# Patient Record
Sex: Female | Born: 1940 | Race: White | Hispanic: No | Marital: Married | Smoking: Never smoker
Health system: Southern US, Community
[De-identification: ages and names within clinical notes are randomized; demographics above are authoritative.]

## PROBLEM LIST (undated history)

## (undated) DIAGNOSIS — M810 Age-related osteoporosis without current pathological fracture: Secondary | ICD-10-CM

## (undated) DIAGNOSIS — IMO0002 Reserved for concepts with insufficient information to code with codable children: Secondary | ICD-10-CM

## (undated) DIAGNOSIS — J189 Pneumonia, unspecified organism: Secondary | ICD-10-CM

## (undated) DIAGNOSIS — G4700 Insomnia, unspecified: Secondary | ICD-10-CM

## (undated) DIAGNOSIS — G43709 Chronic migraine without aura, not intractable, without status migrainosus: Secondary | ICD-10-CM

## (undated) DIAGNOSIS — H8110 Benign paroxysmal vertigo, unspecified ear: Secondary | ICD-10-CM

## (undated) DIAGNOSIS — K219 Gastro-esophageal reflux disease without esophagitis: Secondary | ICD-10-CM

## (undated) HISTORY — PX: ADENOIDECTOMY: SUR15

## (undated) HISTORY — PX: TONSILLECTOMY AND ADENOIDECTOMY: SHX28

## (undated) HISTORY — DX: Benign paroxysmal vertigo, unspecified ear: H81.10

## (undated) HISTORY — DX: Insomnia, unspecified: G47.00

## (undated) HISTORY — PX: TONSILLECTOMY: SUR1361

## (undated) HISTORY — PX: OTHER SURGICAL HISTORY: SHX169

## (undated) HISTORY — DX: Age-related osteoporosis without current pathological fracture: M81.0

## (undated) HISTORY — DX: Pneumonia, unspecified organism: J18.9

## (undated) HISTORY — DX: Reserved for concepts with insufficient information to code with codable children: IMO0002

## (undated) HISTORY — PX: CATARACT EXTRACTION, BILATERAL: SHX1313

## (undated) HISTORY — PX: IRRIGATION AND DEBRIDEMENT SEBACEOUS CYST: SHX5255

## (undated) HISTORY — DX: Chronic migraine without aura, not intractable, without status migrainosus: G43.709

---

## 2006-04-06 ENCOUNTER — Encounter: Admission: RE | Admit: 2006-04-06 | Discharge: 2006-04-06 | Payer: Self-pay | Admitting: Internal Medicine

## 2007-05-17 ENCOUNTER — Encounter: Admission: RE | Admit: 2007-05-17 | Discharge: 2007-05-17 | Payer: Self-pay | Admitting: Internal Medicine

## 2008-05-28 ENCOUNTER — Encounter: Admission: RE | Admit: 2008-05-28 | Discharge: 2008-05-28 | Payer: Self-pay | Admitting: Internal Medicine

## 2009-06-05 ENCOUNTER — Encounter: Admission: RE | Admit: 2009-06-05 | Discharge: 2009-06-05 | Payer: Self-pay | Admitting: Internal Medicine

## 2010-05-31 ENCOUNTER — Other Ambulatory Visit: Payer: Self-pay | Admitting: Internal Medicine

## 2010-05-31 DIAGNOSIS — Z1239 Encounter for other screening for malignant neoplasm of breast: Secondary | ICD-10-CM

## 2010-06-16 ENCOUNTER — Ambulatory Visit
Admission: RE | Admit: 2010-06-16 | Discharge: 2010-06-16 | Disposition: A | Discharge status: Home / Self Care | Payer: Self-pay | Source: Ambulatory Visit | Attending: Internal Medicine | Admitting: Internal Medicine

## 2010-06-16 DIAGNOSIS — Z1239 Encounter for other screening for malignant neoplasm of breast: Secondary | ICD-10-CM

## 2011-05-15 ENCOUNTER — Other Ambulatory Visit: Payer: Self-pay | Admitting: Internal Medicine

## 2011-05-15 DIAGNOSIS — Z1231 Encounter for screening mammogram for malignant neoplasm of breast: Secondary | ICD-10-CM

## 2011-06-08 DIAGNOSIS — R05 Cough: Secondary | ICD-10-CM | POA: Diagnosis not present

## 2011-07-02 ENCOUNTER — Ambulatory Visit
Admission: RE | Admit: 2011-07-02 | Discharge: 2011-07-02 | Disposition: A | Discharge status: Home / Self Care | Payer: 59 | Source: Ambulatory Visit | Attending: Internal Medicine | Admitting: Internal Medicine

## 2011-07-02 DIAGNOSIS — Z1231 Encounter for screening mammogram for malignant neoplasm of breast: Secondary | ICD-10-CM | POA: Diagnosis not present

## 2011-10-13 DIAGNOSIS — R05 Cough: Secondary | ICD-10-CM | POA: Diagnosis not present

## 2011-10-13 DIAGNOSIS — Z23 Encounter for immunization: Secondary | ICD-10-CM | POA: Diagnosis not present

## 2011-10-13 DIAGNOSIS — J209 Acute bronchitis, unspecified: Secondary | ICD-10-CM | POA: Diagnosis not present

## 2011-10-13 DIAGNOSIS — R490 Dysphonia: Secondary | ICD-10-CM | POA: Diagnosis not present

## 2011-12-25 DIAGNOSIS — J189 Pneumonia, unspecified organism: Secondary | ICD-10-CM

## 2011-12-25 HISTORY — DX: Pneumonia, unspecified organism: J18.9

## 2012-01-19 DIAGNOSIS — Z23 Encounter for immunization: Secondary | ICD-10-CM | POA: Diagnosis not present

## 2012-01-25 DIAGNOSIS — R05 Cough: Secondary | ICD-10-CM | POA: Diagnosis not present

## 2012-01-25 DIAGNOSIS — J209 Acute bronchitis, unspecified: Secondary | ICD-10-CM | POA: Diagnosis not present

## 2012-02-10 ENCOUNTER — Ambulatory Visit (INDEPENDENT_AMBULATORY_CARE_PROVIDER_SITE_OTHER): Payer: Medicare Other | Admitting: Internal Medicine

## 2012-02-10 ENCOUNTER — Encounter: Payer: Self-pay | Admitting: Internal Medicine

## 2012-02-10 VITALS — BP 140/80 | HR 81 | Temp 98.5°F | Ht 66.0 in | Wt 139.2 lb

## 2012-02-10 DIAGNOSIS — R059 Cough, unspecified: Secondary | ICD-10-CM

## 2012-02-10 DIAGNOSIS — R05 Cough: Secondary | ICD-10-CM

## 2012-02-10 MED ORDER — OMEPRAZOLE 20 MG PO CPDR: DELAYED_RELEASE_CAPSULE | ORAL | Status: DC

## 2012-02-10 NOTE — Patient Instructions (Addendum)
Stop symbicort and albuterol  Try prilosec 20mg   Take 30-60 min before first meal of the day and Pepcid 20 mg one bedtime until cough is completely gone for at least a week without the need for cough suppression (delsym cough syrup)    GERD (REFLUX)  is an extremely common cause of respiratory symptoms, many times with no significant heartburn at all.    It can be treated with medication, but also with lifestyle changes including avoidance of late meals, excessive alcohol, smoking cessation, and avoid fatty foods, chocolate, peppermint, colas, red wine, and acidic juices such as orange juice.  NO MINT OR MENTHOL PRODUCTS SO NO COUGH DROPS  USE SUGARLESS CANDY INSTEAD (jolley ranchers or Stover's)  NO OIL BASED VITAMINS - use powdered substitutes.   Stay off fosfamax for now - it is available in IV form (reclast) once yearly and that might be a better choice for you if Dr Clelia Croft feels you need it  Stop benadryl and chlortrimeton 4mg  every 4-6 hours   If cough flares return here as soon as possible for step 2 - if cough resolves be sure to let Dr Clelia Croft and your friends know!

## 2012-02-10 NOTE — Progress Notes (Signed)
Subjective:    Patient ID: Shelia Cunningham, female    DOB: 1941/04/01    MRN: [id number]  HPI  26 yowf never smoker with no previous resp problem whatsoever until 04/2009 with persistent daily cough since so referred 02/10/2012 to pulmonary by Dr Olin Hauser.   02/10/2012 1st pulmonary eval cc cough daily best treatment seems to be abx but stopped fosfamax on 01/13/12 and some better since then, in general cough is more dry than wet, throat irritation, worse p stir in am and not present during heavy sleep. No better on symbicort in fact hoarseness worse, cough may be worse as well- no assoc sob or overt sinus or hb symptoms  Sleeping ok without nocturnal  or early am exacerbation  of respiratory  c/o's or need for noct saba. Also denies any obvious fluctuation of symptoms with weather or environmental changes or other aggravating or alleviating factors except as outlined above      Kouffman Reflux v Neurogenic Cough Differentiator Reflux Comments  Do you awaken from a sound sleep coughing violently?                            With trouble breathing? No   Do you have choking episodes when you cannot  Get enough air, gasping for air ?              No   Do you usually cough when you lie down into  The bed, or when you just lie down to rest ?                          Varies, does cough with naps   Do you usually cough after meals or eating?         NO   Do you cough when (or after) you bend over?    No   GERD SCORE     Kouffman Reflux v Neurogenic Cough Differentiator Neurogenic   Do you more-or-less cough all day long? yes   Does change of temperature make you cough? no   Does laughing or chuckling cause you to cough? yes   Do fumes (perfume, automobile fumes, burned  Toast, etc.,) cause you to cough ?      No    Does speaking, singing, or talking on the phone cause you to cough   ?               Yes    Neurogenic/Airway score       Review of Systems  Constitutional: Negative for fever,  chills and unexpected weight change.  HENT: Positive for voice change. Negative for ear pain, nosebleeds, congestion, sore throat, rhinorrhea, sneezing, trouble swallowing, dental problem, postnasal drip and sinus pressure.   Eyes: Negative for visual disturbance.  Respiratory: Positive for cough. Negative for choking and shortness of breath.   Cardiovascular: Negative for chest pain and leg swelling.  Gastrointestinal: Negative for vomiting, abdominal pain and diarrhea.  Genitourinary: Negative for difficulty urinating.  Musculoskeletal: Negative for arthralgias.  Skin: Negative for rash.  Neurological: Negative for tremors, syncope and headaches.  Hematological: Does not bruise/bleed easily.       Objective:   Physical Exam  Very hoarse with voice fatigue Wt Readings from Last 3 Encounters:  02/10/12 139 lb 3.2 oz (63.141 kg)    HEENT: nl dentition, turbinates, and orophanx. Nl external ear canals without cough reflex  NECK :  without JVD/Nodes/TM/ nl carotid upstrokes bilaterally   LUNGS: no acc muscle use, clear to A and P bilaterally without cough on insp or exp maneuvers   CV:  RRR  no s3 or murmur or increase in P2, no edema   ABD:  soft and nontender with nl excursion in the supine position. No bruits or organomegaly, bowel sounds nl  MS:  warm without deformities, calf tenderness, cyanosis or clubbing  SKIN: warm and dry without lesions    NEURO:  alert, approp, no deficits   cxr 01/25/12 Report is wnl      Assessment & Plan:

## 2012-02-11 DIAGNOSIS — R058 Other specified cough: Secondary | ICD-10-CM | POA: Insufficient documentation

## 2012-02-11 DIAGNOSIS — R05 Cough: Secondary | ICD-10-CM | POA: Insufficient documentation

## 2012-02-11 NOTE — Assessment & Plan Note (Addendum)
The most common causes of chronic cough in immunocompetent adults include the following: upper airway cough syndrome (UACS), previously referred to as postnasal drip syndrome (PNDS), which is caused by variety of rhinosinus conditions; (2) asthma; (3) GERD; (4) chronic bronchitis from cigarette smoking or other inhaled environmental irritants; (5) nonasthmatic eosinophilic bronchitis; and (6) bronchiectasis.   These conditions, singly or in combination, have accounted for up to 94% of the causes of chronic cough in prospective studies.   Other conditions have constituted no >6% of the causes in prospective studies These have included bronchogenic carcinoma, chronic interstitial pneumonia, sarcoidosis, left ventricular failure, ACEI-induced cough, and aspiration from a condition associated with pharyngeal dysfunction.   .Chronic cough is often simultaneously caused by more than one condition. A single cause has been found from 38 to 82% of the time, multiple causes from 18 to 62%. Multiply caused cough has been the result of three diseases up to 42% of the time.     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   These are the same pts (now being labeled as having "irritable larynx syndrome" by some cough centers) who not infrequently have a history of having failed to tolerate ace inhibitors,  dry powder inhalers or any form of ICS or biphosphonates or report having atypical reflux symptoms that don't respond to standard doses of PPI , and are easily confused  as having aecopd or asthma flares by even experienced allergists/ pulmonologists.   Will change benadryl to chlortrimeton per cough guidelines (more anticholinergic so more effective in pnds)  For now rec cyclical cough regimen, try off ics and maintain off fosfamax then regroup w/in 4 weeks if not better

## 2012-02-17 DIAGNOSIS — J189 Pneumonia, unspecified organism: Secondary | ICD-10-CM | POA: Diagnosis not present

## 2012-02-19 ENCOUNTER — Institutional Professional Consult (permissible substitution): Payer: Medicare Other | Admitting: Pulmonary Disease

## 2012-02-23 ENCOUNTER — Encounter: Payer: Self-pay | Admitting: Internal Medicine

## 2012-02-23 DIAGNOSIS — J189 Pneumonia, unspecified organism: Secondary | ICD-10-CM | POA: Insufficient documentation

## 2012-04-19 DIAGNOSIS — G43909 Migraine, unspecified, not intractable, without status migrainosus: Secondary | ICD-10-CM | POA: Diagnosis not present

## 2012-04-19 DIAGNOSIS — R82998 Other abnormal findings in urine: Secondary | ICD-10-CM | POA: Diagnosis not present

## 2012-04-19 DIAGNOSIS — G4700 Insomnia, unspecified: Secondary | ICD-10-CM | POA: Diagnosis not present

## 2012-04-19 DIAGNOSIS — Z0000 Encounter for general adult medical examination without abnormal findings: Secondary | ICD-10-CM | POA: Diagnosis not present

## 2012-04-19 DIAGNOSIS — M899 Disorder of bone, unspecified: Secondary | ICD-10-CM | POA: Diagnosis not present

## 2012-04-19 DIAGNOSIS — M949 Disorder of cartilage, unspecified: Secondary | ICD-10-CM | POA: Diagnosis not present

## 2012-04-25 DIAGNOSIS — H8110 Benign paroxysmal vertigo, unspecified ear: Secondary | ICD-10-CM | POA: Diagnosis not present

## 2012-04-25 DIAGNOSIS — G4700 Insomnia, unspecified: Secondary | ICD-10-CM | POA: Diagnosis not present

## 2012-04-25 DIAGNOSIS — Z0000 Encounter for general adult medical examination without abnormal findings: Secondary | ICD-10-CM | POA: Diagnosis not present

## 2012-04-25 DIAGNOSIS — R05 Cough: Secondary | ICD-10-CM | POA: Diagnosis not present

## 2012-04-27 ENCOUNTER — Encounter: Payer: Self-pay | Admitting: Gastroenterology

## 2012-04-28 DIAGNOSIS — Z1212 Encounter for screening for malignant neoplasm of rectum: Secondary | ICD-10-CM | POA: Diagnosis not present

## 2012-05-24 ENCOUNTER — Ambulatory Visit (AMBULATORY_SURGERY_CENTER): Payer: Medicare Other

## 2012-05-24 VITALS — Ht 65.5 in | Wt 141.8 lb

## 2012-05-24 DIAGNOSIS — Z1211 Encounter for screening for malignant neoplasm of colon: Secondary | ICD-10-CM

## 2012-05-24 MED ORDER — MOVIPREP 100 G PO SOLR: ORAL | Status: DC

## 2012-06-02 DIAGNOSIS — M810 Age-related osteoporosis without current pathological fracture: Secondary | ICD-10-CM | POA: Diagnosis not present

## 2012-06-02 DIAGNOSIS — R05 Cough: Secondary | ICD-10-CM | POA: Diagnosis not present

## 2012-06-06 ENCOUNTER — Encounter: Payer: Self-pay | Admitting: Gastroenterology

## 2012-06-06 ENCOUNTER — Ambulatory Visit (AMBULATORY_SURGERY_CENTER): Payer: Medicare Other | Admitting: Gastroenterology

## 2012-06-06 VITALS — BP 128/61 | HR 60 | Temp 97.5°F | Resp 14 | Ht 65.5 in | Wt 141.0 lb

## 2012-06-06 DIAGNOSIS — R05 Cough: Secondary | ICD-10-CM | POA: Diagnosis not present

## 2012-06-06 DIAGNOSIS — Z1211 Encounter for screening for malignant neoplasm of colon: Secondary | ICD-10-CM | POA: Diagnosis not present

## 2012-06-06 MED ORDER — SODIUM CHLORIDE 0.9 % IV SOLN: 500.0000 mL | INTRAVENOUS | Status: DC

## 2012-06-06 NOTE — Op Note (Signed)
[facility] [address] [place], [address]   COLONOSCOPY PROCEDURE REPORT  PATIENT: Shelia Cunningham, Shelia Cunningham  MR#: [id number] BIRTHDATE: 1941-02-26 , 71  yrs. old GENDER: Female ENDOSCOPIST: Meryl Dare, MD, [facility] REFERRED BY:W.  Buren Kos, M.D. PROCEDURE DATE:  06/06/2012 PROCEDURE:   Colonoscopy, screening ASA CLASS:   Class II INDICATIONS:average risk screening. MEDICATIONS: MAC sedation, administered by CRNA and propofol (Diprivan) 350mg  IV DESCRIPTION OF PROCEDURE:   After the risks benefits and alternatives of the procedure were thoroughly explained, informed consent was obtained.  A digital rectal exam revealed no abnormalities of the rectum.   The LB [id number]-[id number]  endoscope was introduced through the anus and advanced to the cecum, which was identified by both the appendix and ileocecal valve. No adverse events experienced.   The quality of the prep was good, using MoviPrep  The instrument was then slowly withdrawn as the colon was fully examined.  COLON FINDINGS: A normal appearing cecum, ileocecal valve, and appendiceal orifice were identified.  The ascending, hepatic flexure, transverse, splenic flexure, descending, sigmoid colon and rectum appeared unremarkable.  No polyps or cancers were seen. Retroflexed views revealed no abnormalities. The time to cecum=4 minutes 06 seconds.  Withdrawal time=11 minutes 01 seconds.  The scope was withdrawn and the procedure completed.  COMPLICATIONS: There were no complications.  ENDOSCOPIC IMPRESSION: 1.  Normal colon  RECOMMENDATIONS: 1.  Given your age, you will not need another colonoscopy for colon cancer screening or polyp surveillance.  These types of tests usually stop around the age 35.   eSigned:  Meryl Dare, MD, [facility] 06/06/2012 9:26 AM

## 2012-06-06 NOTE — Patient Instructions (Addendum)

## 2012-06-06 NOTE — Progress Notes (Signed)
VSS. A&O x3, Pleased with MAC. Report to April RN. DRM

## 2012-06-06 NOTE — Progress Notes (Signed)
Patient did not experience any of the following events: a burn prior to discharge; a fall within the facility; wrong site/side/patient/procedure/implant event; or a hospital transfer or hospital admission upon discharge from the facility. (G8907) Patient did not have preoperative order for IV antibiotic SSI prophylaxis. (G8918)  

## 2012-06-07 ENCOUNTER — Telehealth: Payer: Self-pay | Admitting: *Deleted

## 2012-06-07 NOTE — Telephone Encounter (Signed)
  Follow up Call-  Call back number 06/06/2012  Post procedure Call Back phone  # [phone]  Permission to leave phone message Yes     Patient questions:  Do you have a fever, pain , or abdominal swelling? no Pain Score  0 *  Have you tolerated food without any problems? yes  Have you been able to return to your normal activities? yes  Do you have any questions about your discharge instructions: Diet   no Medications  no Follow up visit  no  Do you have questions or concerns about your Care? no  Actions: * If pain score is 4 or above: No action needed, pain <4.

## 2012-06-13 ENCOUNTER — Other Ambulatory Visit: Payer: Self-pay | Admitting: Internal Medicine

## 2012-06-13 DIAGNOSIS — Z1231 Encounter for screening mammogram for malignant neoplasm of breast: Secondary | ICD-10-CM

## 2012-07-15 ENCOUNTER — Ambulatory Visit: Payer: Medicare Other

## 2012-08-08 ENCOUNTER — Ambulatory Visit
Admission: RE | Admit: 2012-08-08 | Discharge: 2012-08-08 | Disposition: A | Discharge status: Home / Self Care | Payer: Medicare Other | Source: Ambulatory Visit | Attending: Internal Medicine | Admitting: Internal Medicine

## 2012-08-08 DIAGNOSIS — Z1231 Encounter for screening mammogram for malignant neoplasm of breast: Secondary | ICD-10-CM | POA: Diagnosis not present

## 2012-09-21 DIAGNOSIS — IMO0002 Reserved for concepts with insufficient information to code with codable children: Secondary | ICD-10-CM | POA: Diagnosis not present

## 2012-09-21 DIAGNOSIS — A088 Other specified intestinal infections: Secondary | ICD-10-CM | POA: Diagnosis not present

## 2012-09-21 DIAGNOSIS — R634 Abnormal weight loss: Secondary | ICD-10-CM | POA: Diagnosis not present

## 2012-09-23 ENCOUNTER — Inpatient Hospital Stay (HOSPITAL_COMMUNITY): Payer: Medicare Other | Admitting: Certified Registered"

## 2012-09-23 ENCOUNTER — Inpatient Hospital Stay (HOSPITAL_COMMUNITY)
Admission: EM | Admit: 2012-09-23 | Discharge: 2012-10-01 | DRG: 339 | Disposition: A | Discharge status: Home / Self Care | Payer: Medicare Other | Attending: General Surgery | Admitting: General Surgery

## 2012-09-23 ENCOUNTER — Encounter (HOSPITAL_COMMUNITY): Payer: Self-pay | Admitting: Certified Registered"

## 2012-09-23 ENCOUNTER — Encounter (HOSPITAL_COMMUNITY): Admission: EM | Disposition: A | Discharge status: Home / Self Care | Payer: Self-pay | Source: Home / Self Care

## 2012-09-23 ENCOUNTER — Encounter (HOSPITAL_COMMUNITY): Payer: Self-pay | Admitting: Emergency Medicine

## 2012-09-23 DIAGNOSIS — K37 Unspecified appendicitis: Secondary | ICD-10-CM

## 2012-09-23 DIAGNOSIS — R05 Cough: Secondary | ICD-10-CM

## 2012-09-23 DIAGNOSIS — K560 Paralytic ileus: Secondary | ICD-10-CM | POA: Diagnosis present

## 2012-09-23 DIAGNOSIS — T40605A Adverse effect of unspecified narcotics, initial encounter: Secondary | ICD-10-CM | POA: Diagnosis not present

## 2012-09-23 DIAGNOSIS — K56609 Unspecified intestinal obstruction, unspecified as to partial versus complete obstruction: Secondary | ICD-10-CM | POA: Diagnosis not present

## 2012-09-23 DIAGNOSIS — K219 Gastro-esophageal reflux disease without esophagitis: Secondary | ICD-10-CM | POA: Diagnosis not present

## 2012-09-23 DIAGNOSIS — M4120 Other idiopathic scoliosis, site unspecified: Secondary | ICD-10-CM | POA: Diagnosis not present

## 2012-09-23 DIAGNOSIS — J189 Pneumonia, unspecified organism: Secondary | ICD-10-CM

## 2012-09-23 DIAGNOSIS — E871 Hypo-osmolality and hyponatremia: Secondary | ICD-10-CM | POA: Diagnosis not present

## 2012-09-23 DIAGNOSIS — F19921 Other psychoactive substance use, unspecified with intoxication with delirium: Secondary | ICD-10-CM | POA: Diagnosis not present

## 2012-09-23 DIAGNOSIS — K3533 Acute appendicitis with perforation and localized peritonitis, with abscess: Principal | ICD-10-CM | POA: Diagnosis present

## 2012-09-23 DIAGNOSIS — R404 Transient alteration of awareness: Secondary | ICD-10-CM | POA: Diagnosis not present

## 2012-09-23 DIAGNOSIS — R5381 Other malaise: Secondary | ICD-10-CM | POA: Diagnosis not present

## 2012-09-23 DIAGNOSIS — K352 Acute appendicitis with generalized peritonitis, without abscess: Secondary | ICD-10-CM

## 2012-09-23 DIAGNOSIS — K3580 Unspecified acute appendicitis: Secondary | ICD-10-CM | POA: Diagnosis not present

## 2012-09-23 DIAGNOSIS — K651 Peritoneal abscess: Secondary | ICD-10-CM

## 2012-09-23 HISTORY — DX: Gastro-esophageal reflux disease without esophagitis: K21.9

## 2012-09-23 HISTORY — PX: LAPAROSCOPIC APPENDECTOMY: SHX408

## 2012-09-23 LAB — URINALYSIS, ROUTINE W REFLEX MICROSCOPIC
Bilirubin Urine: NEGATIVE
Ketones, ur: 40 mg/dL — AB
Nitrite: NEGATIVE
Protein, ur: 100 mg/dL — AB
Urobilinogen, UA: 0.2 mg/dL (ref 0.0–1.0)

## 2012-09-23 LAB — COMPREHENSIVE METABOLIC PANEL
ALT: 24 U/L (ref 0–35)
Alkaline Phosphatase: 95 U/L (ref 39–117)
CO2: 25 mEq/L (ref 19–32)
GFR calc Af Amer: >90 mL/min (ref >90)
GFR calc non Af Amer: 83 mL/min — ABNORMAL LOW (ref >90)
Glucose, Bld: 123 mg/dL — ABNORMAL HIGH (ref 70–99)
Potassium: 3.5 mEq/L (ref 3.5–5.1)
Sodium: 133 mEq/L — ABNORMAL LOW (ref 135–145)
Total Bilirubin: 0.4 mg/dL (ref 0.3–1.2)

## 2012-09-23 LAB — CBC WITH DIFFERENTIAL/PLATELET
Eosinophils Relative: 0 % (ref 0–5)
Hemoglobin: 14.4 g/dL (ref 12.0–15.0)
Lymphocytes Relative: 9 % — ABNORMAL LOW (ref 12–46)
Lymphs Abs: 1.1 10*3/uL (ref 0.7–4.0)
MCV: 87.3 fL (ref 78.0–100.0)
Monocytes Relative: 6 % (ref 3–12)
Neutrophils Relative %: 84 % — ABNORMAL HIGH (ref 43–77)
Platelets: 270 10*3/uL (ref 150–400)
RBC: 4.73 MIL/uL (ref 3.87–5.11)
WBC: 12.5 10*3/uL — ABNORMAL HIGH (ref 4.0–10.5)

## 2012-09-23 LAB — URINE MICROSCOPIC-ADD ON

## 2012-09-23 SURGERY — APPENDECTOMY, LAPAROSCOPIC
Anesthesia: General | Site: Abdomen | Wound class: Dirty or Infected

## 2012-09-23 MED ORDER — ONDANSETRON HCL 4 MG/2ML IJ SOLN
INTRAMUSCULAR | Status: DC | PRN
  Administered 2012-09-23: 4 mg via INTRAVENOUS

## 2012-09-23 MED ORDER — GLYCOPYRROLATE 0.2 MG/ML IJ SOLN
INTRAMUSCULAR | Status: DC | PRN
  Administered 2012-09-23: 0.4 mg via INTRAVENOUS

## 2012-09-23 MED ORDER — BUPIVACAINE-EPINEPHRINE 0.25% -1:200000 IJ SOLN
INTRAMUSCULAR | Status: DC | PRN
  Administered 2012-09-23: 18 mL

## 2012-09-23 MED ORDER — ONDANSETRON HCL 4 MG/2ML IJ SOLN
4.0000 mg | Freq: Once | INTRAMUSCULAR | Status: AC
  Administered 2012-09-23: 4 mg via INTRAVENOUS
  Filled 2012-09-23: qty 2

## 2012-09-23 MED ORDER — PIPERACILLIN-TAZOBACTAM 3.375 G IVPB
3.3750 g | Freq: Three times a day (TID) | INTRAVENOUS | Status: DC
  Administered 2012-09-23 – 2012-09-30 (×20): 3.375 g via INTRAVENOUS
  Filled 2012-09-23 (×22): qty 50

## 2012-09-23 MED ORDER — OXYCODONE HCL 5 MG PO TABS: 5.0000 mg | ORAL_TABLET | ORAL | Status: DC | PRN

## 2012-09-23 MED ORDER — PROPOFOL 10 MG/ML IV BOLUS
INTRAVENOUS | Status: DC | PRN
  Administered 2012-09-23: 40 mg via INTRAVENOUS
  Administered 2012-09-23: 160 mg via INTRAVENOUS

## 2012-09-23 MED ORDER — LIDOCAINE HCL (CARDIAC) 20 MG/ML IV SOLN
INTRAVENOUS | Status: DC | PRN
  Administered 2012-09-23: 60 mg via INTRAVENOUS

## 2012-09-23 MED ORDER — SODIUM CHLORIDE 0.9 % IV BOLUS (SEPSIS)
500.0000 mL | Freq: Once | INTRAVENOUS | Status: AC
  Administered 2012-09-23: 500 mL via INTRAVENOUS

## 2012-09-23 MED ORDER — HYDROMORPHONE HCL PF 1 MG/ML IJ SOLN
0.2500 mg | INTRAMUSCULAR | Status: DC | PRN
  Administered 2012-09-23 (×2): 0.5 mg via INTRAVENOUS

## 2012-09-23 MED ORDER — SODIUM CHLORIDE 0.9 % IR SOLN
Status: DC | PRN
  Administered 2012-09-23: 1000 mL

## 2012-09-23 MED ORDER — ALBUTEROL SULFATE HFA 108 (90 BASE) MCG/ACT IN AERS
2.0000 | INHALATION_SPRAY | Freq: Four times a day (QID) | RESPIRATORY_TRACT | Status: DC | PRN
  Filled 2012-09-23: qty 6.7

## 2012-09-23 MED ORDER — LACTATED RINGERS IV SOLN
INTRAVENOUS | Status: DC | PRN
  Administered 2012-09-23 (×2): via INTRAVENOUS

## 2012-09-23 MED ORDER — ROCURONIUM BROMIDE 100 MG/10ML IV SOLN
INTRAVENOUS | Status: DC | PRN
  Administered 2012-09-23: 30 mg via INTRAVENOUS

## 2012-09-23 MED ORDER — ONDANSETRON HCL 4 MG/2ML IJ SOLN
4.0000 mg | Freq: Four times a day (QID) | INTRAMUSCULAR | Status: DC | PRN
  Administered 2012-09-23 – 2012-09-25 (×4): 4 mg via INTRAVENOUS
  Filled 2012-09-23 (×4): qty 2

## 2012-09-23 MED ORDER — NEOSTIGMINE METHYLSULFATE 1 MG/ML IJ SOLN
INTRAMUSCULAR | Status: DC | PRN
  Administered 2012-09-23: 3 mg via INTRAVENOUS

## 2012-09-23 MED ORDER — DEXAMETHASONE SODIUM PHOSPHATE 10 MG/ML IJ SOLN
INTRAMUSCULAR | Status: DC | PRN
  Administered 2012-09-23: 4 mg via INTRAVENOUS

## 2012-09-23 MED ORDER — KCL IN DEXTROSE-NACL 20-5-0.45 MEQ/L-%-% IV SOLN
INTRAVENOUS | Status: DC
  Administered 2012-09-23 – 2012-09-24 (×2): via INTRAVENOUS
  Filled 2012-09-23 (×6): qty 1000

## 2012-09-23 MED ORDER — FENTANYL CITRATE 0.05 MG/ML IJ SOLN
INTRAMUSCULAR | Status: DC | PRN
  Administered 2012-09-23: 100 ug via INTRAVENOUS
  Administered 2012-09-23 (×3): 50 ug via INTRAVENOUS

## 2012-09-23 MED ORDER — HEPARIN SODIUM (PORCINE) 5000 UNIT/ML IJ SOLN
5000.0000 [IU] | Freq: Three times a day (TID) | INTRAMUSCULAR | Status: DC
  Administered 2012-09-23 – 2012-10-01 (×23): 5000 [IU] via SUBCUTANEOUS
  Filled 2012-09-23 (×29): qty 1

## 2012-09-23 MED ORDER — HYDROMORPHONE HCL PF 1 MG/ML IJ SOLN
INTRAMUSCULAR | Status: AC
  Filled 2012-09-23: qty 1

## 2012-09-23 MED ORDER — AMLODIPINE BESYLATE 5 MG PO TABS
5.0000 mg | ORAL_TABLET | Freq: Every day | ORAL | Status: DC
  Administered 2012-09-24: 5 mg via ORAL
  Filled 2012-09-23 (×9): qty 1

## 2012-09-23 MED ORDER — 0.9 % SODIUM CHLORIDE (POUR BTL) OPTIME
TOPICAL | Status: DC | PRN
  Administered 2012-09-23: 1000 mL

## 2012-09-23 MED ORDER — SUCCINYLCHOLINE CHLORIDE 20 MG/ML IJ SOLN
INTRAMUSCULAR | Status: DC | PRN
  Administered 2012-09-23: 100 mg via INTRAVENOUS

## 2012-09-23 MED ORDER — FENTANYL CITRATE 0.05 MG/ML IJ SOLN
50.0000 ug | Freq: Once | INTRAMUSCULAR | Status: AC
  Administered 2012-09-23: 50 ug via INTRAVENOUS
  Filled 2012-09-23: qty 2

## 2012-09-23 MED ORDER — PIPERACILLIN-TAZOBACTAM 3.375 G IVPB 30 MIN
3.3750 g | Freq: Once | INTRAVENOUS | Status: AC
  Administered 2012-09-23: 3.375 g via INTRAVENOUS
  Filled 2012-09-23: qty 50

## 2012-09-23 MED ORDER — MORPHINE SULFATE 2 MG/ML IJ SOLN
1.0000 mg | INTRAMUSCULAR | Status: DC | PRN
  Administered 2012-09-23 (×2): 2 mg via INTRAVENOUS
  Filled 2012-09-23 (×2): qty 1

## 2012-09-23 MED ORDER — BUDESONIDE-FORMOTEROL FUMARATE 80-4.5 MCG/ACT IN AERO
2.0000 | INHALATION_SPRAY | Freq: Two times a day (BID) | RESPIRATORY_TRACT | Status: DC
  Filled 2012-09-23: qty 6.9

## 2012-09-23 MED ORDER — ALBUMIN HUMAN 5 % IV SOLN
INTRAVENOUS | Status: DC | PRN
  Administered 2012-09-23: 14:00:00 via INTRAVENOUS

## 2012-09-23 MED ORDER — MIDAZOLAM HCL 5 MG/5ML IJ SOLN
INTRAMUSCULAR | Status: DC | PRN
  Administered 2012-09-23: 2 mg via INTRAVENOUS

## 2012-09-23 MED ORDER — PANTOPRAZOLE SODIUM 40 MG PO TBEC
40.0000 mg | DELAYED_RELEASE_TABLET | Freq: Every day | ORAL | Status: DC
  Administered 2012-09-23 – 2012-10-01 (×6): 40 mg via ORAL
  Filled 2012-09-23 (×7): qty 1

## 2012-09-23 SURGICAL SUPPLY — 59 items
ADH SKN CLS APL DERMABOND .7 (GAUZE/BANDAGES/DRESSINGS) ×1
APPLIER CLIP ROT 10 11.4 M/L (STAPLE)
APR CLP MED LRG 11.4X10 (STAPLE)
BAG SPEC RTRVL LRG 6X4 10 (ENDOMECHANICALS) ×1
BLADE SURG ROTATE 9660 (MISCELLANEOUS) IMPLANT
CANISTER SUCTION 2500CC (MISCELLANEOUS) ×2 IMPLANT
CHLORAPREP W/TINT 26ML (MISCELLANEOUS) ×2 IMPLANT
CLIP APPLIE ROT 10 11.4 M/L (STAPLE) IMPLANT
CLOTH BEACON ORANGE TIMEOUT ST (SAFETY) ×2 IMPLANT
COVER SURGICAL LIGHT HANDLE (MISCELLANEOUS) ×2 IMPLANT
CUTTER FLEX LINEAR 45M (STAPLE) ×1 IMPLANT
CUTTER LINEAR ENDO 35 ETS (STAPLE) IMPLANT
CUTTER LINEAR ENDO 35 ETS TH (STAPLE) IMPLANT
DECANTER SPIKE VIAL GLASS SM (MISCELLANEOUS) ×2 IMPLANT
DERMABOND ADVANCED (GAUZE/BANDAGES/DRESSINGS) ×1
DERMABOND ADVANCED .7 DNX12 (GAUZE/BANDAGES/DRESSINGS) ×1 IMPLANT
DRAIN CHANNEL 19F RND (DRAIN) ×1 IMPLANT
DRAPE UTILITY 15X26 W/TAPE STR (DRAPE) ×4 IMPLANT
ELECT REM PT RETURN 9FT ADLT (ELECTROSURGICAL) ×2
ELECTRODE REM PT RTRN 9FT ADLT (ELECTROSURGICAL) ×1 IMPLANT
ENDOLOOP SUT PDS II  0 18 (SUTURE)
ENDOLOOP SUT PDS II 0 18 (SUTURE) IMPLANT
EVACUATOR SILICONE 100CC (DRAIN) ×1 IMPLANT
GLOVE BIO SURGEON STRL SZ7.5 (GLOVE) ×1 IMPLANT
GLOVE BIO SURGEON STRL SZ8 (GLOVE) ×2 IMPLANT
GLOVE BIOGEL M 6.5 STRL (GLOVE) ×1 IMPLANT
GLOVE BIOGEL PI IND STRL 7.5 (GLOVE) IMPLANT
GLOVE BIOGEL PI IND STRL 8 (GLOVE) ×1 IMPLANT
GLOVE BIOGEL PI INDICATOR 7.5 (GLOVE) ×2
GLOVE BIOGEL PI INDICATOR 8 (GLOVE) ×1
GLOVE ECLIPSE 7.5 STRL STRAW (GLOVE) ×1 IMPLANT
GOWN PREVENTION PLUS XLARGE (GOWN DISPOSABLE) ×2 IMPLANT
GOWN STRL NON-REIN LRG LVL3 (GOWN DISPOSABLE) ×4 IMPLANT
KIT BASIN OR (CUSTOM PROCEDURE TRAY) ×2 IMPLANT
KIT ROOM TURNOVER OR (KITS) ×2 IMPLANT
NS IRRIG 1000ML POUR BTL (IV SOLUTION) ×2 IMPLANT
PAD ARMBOARD 7.5X6 YLW CONV (MISCELLANEOUS) ×4 IMPLANT
POUCH SPECIMEN RETRIEVAL 10MM (ENDOMECHANICALS) ×2 IMPLANT
RELOAD /EVU35 (ENDOMECHANICALS) ×1 IMPLANT
RELOAD 45 VASCULAR/THIN (ENDOMECHANICALS) ×2 IMPLANT
RELOAD CUTTER ETS 35MM STAND (ENDOMECHANICALS) IMPLANT
RELOAD STAPLE 45 2.5 WHT GRN (ENDOMECHANICALS) IMPLANT
SCALPEL HARMONIC ACE (MISCELLANEOUS) ×2 IMPLANT
SET IRRIG TUBING LAPAROSCOPIC (IRRIGATION / IRRIGATOR) ×2 IMPLANT
SPECIMEN JAR SMALL (MISCELLANEOUS) ×2 IMPLANT
SPONGE GAUZE 4X4 12PLY (GAUZE/BANDAGES/DRESSINGS) ×1 IMPLANT
SUT ETHILON 2 0 FS 18 (SUTURE) ×1 IMPLANT
SUT MNCRL AB 4-0 PS2 18 (SUTURE) ×1 IMPLANT
SUT VIC AB 4-0 PS2 27 (SUTURE) ×2 IMPLANT
SWAB COLLECTION DEVICE MRSA (MISCELLANEOUS) IMPLANT
TOWEL OR 17X24 6PK STRL BLUE (TOWEL DISPOSABLE) ×2 IMPLANT
TOWEL OR 17X26 10 PK STRL BLUE (TOWEL DISPOSABLE) ×2 IMPLANT
TRAY FOLEY CATH 14FR (SET/KITS/TRAYS/PACK) ×2 IMPLANT
TRAY LAPAROSCOPIC (CUSTOM PROCEDURE TRAY) ×2 IMPLANT
TROCAR XCEL 12X100 BLDLESS (ENDOMECHANICALS) ×2 IMPLANT
TROCAR XCEL BLUNT TIP 100MML (ENDOMECHANICALS) ×2 IMPLANT
TROCAR XCEL NON-BLD 5MMX100MML (ENDOMECHANICALS) ×2 IMPLANT
TUBE ANAEROBIC SPECIMEN COL (MISCELLANEOUS) IMPLANT
WATER STERILE IRR 1000ML POUR (IV SOLUTION) ×2 IMPLANT

## 2012-09-23 NOTE — H&P (Addendum)
Shelia Cunningham is an 72 y.o. female.   Chief Complaint: Abdominal pain, nausea and fever. HPI: Patient is a healthy 72 year old female she reports Monday 09/20/12 she developed some abdominal pain in her mid abdomen. She became nauseated and spiked a fever to 101.9. She continued to have discomfort throughout the day/night and into Tuesday. She continued to have fever and then developed diarrhea, which has persisted thru today. She has had ongoing  nausea with  a single episode of vomiting on Tuesday 09/21/12. She continued to have symptoms and was seen by Dr. Clelia Croft on Wednesday 09/22/12. She had some diffuse discomfort on abdominal exam according to the patient. She was treated with anti-emetics, she continued to have symptoms and on Thursday he scheduled her for a CT scan today.  Patient went for a CT scan this morning 09/24/14, it was done an outside facility. This shows acute advanced appendicitis with appendicolith and a high-grade small bowel obstruction. Dr. Janee Morn has reviewed the film and it is his opinion she also has an abscess. We are asked to see the patient in the ER at [facility] and plan to take her to the operating room as soon as they are ready.  Past Medical History  Diagnosis Date  . Chronic migraine   . Pneumonia, organism unspecified, 2013. 12-25-11    osteopenia and intolerant to Fosamax      history of herniated disc and scoliosis    . Vertigo, benign positional     Past Surgical History  Procedure Laterality Date  . Tonsillectomy and adenoidectomy     . Tonsillectomy        2 vaginal delivery       colonoscopy January 2013 Dr. Claudette Head    . Irrigation and debridement sebaceous cyst      on scalp/ 2 cysts  removed    Family History  Problem Relation Age of Onset  . Breast cancer Mother   . Cancer Father     bladder   Social History:  reports that she has never smoked. She has never used smokeless tobacco. She reports that she drinks about 4.2 ounces  of alcohol per week. She reports that she does not use illicit drugs. Drugs:  None Tobacco:  None Retired Education officer, [employer]. Allergies:  Allergies  Allergen Reactions  . Biaxin (Clarithromycin) Rash     (Not in a hospital admission)  Results for orders placed during the hospital encounter of 09/23/12 (from the past 48 hour(s))  CBC WITH DIFFERENTIAL     Status: Abnormal   Collection Time    09/23/12 11:24 AM      Result Value Range   WBC 12.5 (*) 4.0 - 10.5 K/uL   RBC 4.73  3.87 - 5.11 MIL/uL   Hemoglobin 14.4  12.0 - 15.0 g/dL   HCT 09.6  04.5 - 40.9 %   MCV 87.3  78.0 - 100.0 fL   MCH 30.4  26.0 - 34.0 pg   MCHC 34.9  30.0 - 36.0 g/dL   RDW 81.1  91.4 - 78.2 %   Platelets 270  150 - 400 K/uL   Neutrophils Relative % 84 (*) 43 - 77 %   Neutro Abs 10.5 (*) 1.7 - 7.7 K/uL   Lymphocytes Relative 9 (*) 12 - 46 %   Lymphs Abs 1.1  0.7 - 4.0 K/uL   Monocytes Relative 6  3 - 12 %   Monocytes Absolute 0.8  0.1 - 1.0 K/uL  Eosinophils Relative 0  0 - 5 %   Eosinophils Absolute 0.0  0.0 - 0.7 K/uL   Basophils Relative 0  0 - 1 %   Basophils Absolute 0.0  0.0 - 0.1 K/uL   No results found.  Review of Systems  Constitutional: Positive for fever (5 pounds since Monday), chills and weight loss. Negative for malaise/fatigue and diaphoresis.  HENT: Negative for hearing loss, ear pain, nosebleeds, congestion, sore throat, tinnitus and ear discharge.        Hx of chronic Migraines  Eyes: Negative for blurred vision, double vision, photophobia, pain, discharge and redness.       Glasses for distance  Respiratory: Negative.  Negative for stridor.   Cardiovascular: Negative.   Gastrointestinal: Positive for nausea (with oral intake and off antiememtic), vomiting (once Tuesday 09/20/12), abdominal pain (stared in the Mid abdomen, has become progressively worse.) and diarrhea (significant since Monday PM). Negative for heartburn, constipation, blood in stool and melena.   Musculoskeletal: Negative.   Skin: Negative for itching and rash.       She had some tick bites from garden, on her legs, but have resolved.  Neurological: Positive for headaches. Negative for dizziness, tingling, tremors, sensory change, speech change, focal weakness, seizures, loss of consciousness and weakness.  Endo/Heme/Allergies: Negative.   Psychiatric/Behavioral: Negative.     Blood pressure 130/50, pulse 112, temperature 98 F (36.7 C), temperature source Oral, resp. rate 18, SpO2 96.00%. Physical Exam  Constitutional: She is oriented to person, place, and time. She appears well-developed. No distress.  Thin elderly WF in NAD  HENT:  Head: Normocephalic and atraumatic.  Nose: Nose normal.  Mouth/Throat: Oropharyngeal exudate (white film on tongue.) present.  Eyes: Conjunctivae and EOM are normal. Pupils are equal, round, and reactive to light. Right eye exhibits no discharge. Left eye exhibits no discharge. No scleral icterus.  Neck: Normal range of motion. Neck supple. No JVD present. No tracheal deviation present. No thyromegaly present.  Cardiovascular: Regular rhythm, normal heart sounds and intact distal pulses.  Exam reveals no gallop.   No murmur heard. Tachycardic BP 130/50  Pulse 112  Temp(Src) 98 F (36.7 C) (Oral)  Resp 18  SpO2 96%   Respiratory: Effort normal and breath sounds normal. No stridor. No respiratory distress. She has no wheezes. She has no rales. She exhibits no tenderness.  GI: Soft. She exhibits distension (Mild, says she feel pregnant.). She exhibits no mass. There is tenderness (Most discomfort is in the mid area around the umbilicus). There is no rebound and no guarding.  BS hypoactive   Musculoskeletal: She exhibits no edema and no tenderness.  Lymphadenopathy:    She has no cervical adenopathy.  Neurological: She is alert and oriented to person, place, and time. No cranial nerve deficit.  Skin: Skin is warm and dry. No rash noted. She is  not diaphoretic. No erythema. No pallor.  Psychiatric: She has a normal mood and affect. Her behavior is normal. Judgment and thought content normal.     Assessment/Plan 1.Appendicitis with abscess. 2. History of chronic migraine 3. Osteopenia with an intolerance to 4. History of herniated disc in the past 5. Scoliosis  Plan: Patient will be admitted she's been started on IV antibiotics and taken directly to the OR as soon as the room is available. [facility] physician assistant for Dr. Violeta Gelinas.   Beaulah Romanek 09/23/2012, 12:29 PM

## 2012-09-23 NOTE — ED Notes (Signed)
Dr. Cook at the bedside.  

## 2012-09-23 NOTE — Anesthesia Preprocedure Evaluation (Addendum)
Anesthesia Evaluation  Patient identified by MRN, date of birth, ID band Patient awake    Reviewed: Allergy & Precautions, H&P , NPO status , Patient's Chart, lab work & pertinent test results, reviewed documented beta blocker date and time   Airway Mallampati: II TM Distance: >3 FB Neck ROM: full    Dental   Pulmonary pneumonia -, resolved,  breath sounds clear to auscultation        Cardiovascular negative cardio ROS  Rhythm:Regular Rate:Normal     Neuro/Psych  Headaches,    GI/Hepatic Neg liver ROS, History of n/v.   Endo/Other  negative endocrine ROS  Renal/GU      Musculoskeletal   Abdominal   Peds  Hematology   Anesthesia Other Findings   Reproductive/Obstetrics                          Anesthesia Physical Anesthesia Plan  ASA: II and emergent  Anesthesia Plan: General ETT, Rapid Sequence and Cricoid Pressure   Post-op Pain Management:    Induction: Intravenous  Airway Management Planned: Oral ETT  Additional Equipment:   Intra-op Plan:   Post-operative Plan: Extubation in OR  Informed Consent: I have reviewed the patients History and Physical, chart, labs and discussed the procedure including the risks, benefits and alternatives for the proposed anesthesia with the patient or authorized representative who has indicated his/her understanding and acceptance.   Dental Advisory Given  Plan Discussed with: CRNA, Anesthesiologist and Surgeon  Anesthesia Plan Comments:       Anesthesia Quick Evaluation

## 2012-09-23 NOTE — Preoperative (Signed)
Beta Blockers   Reason not to administer Beta Blockers:Not Applicable 

## 2012-09-23 NOTE — H&P (Signed)
Acute appendicitis with likely rupture and early abscess formation in the pelvis.  For laparoscopic appendectomy.  Procedure, risks, benefits D/W patient.  She agrees.  Will give IV Zosyn. Patient examined and I agree with the assessment and plan  Violeta Gelinas, MD, MPH, FACS Pager: [phone]  09/23/2012 1:11 PM

## 2012-09-23 NOTE — ED Provider Notes (Signed)
History     CSN: [id number]  Arrival date & time 09/23/12  1107   First MD Initiated Contact with Patient 09/23/12 1109      Chief Complaint  Patient presents with  . Abdominal Pain    (Consider location/radiation/quality/duration/timing/severity/associated sxs/prior treatment) HPI.... generalized abdominal pain since Monday worse in the lower abdomen with associated anorexia.  No fever, chills, vomiting, previous abdominal surgery. Does complain of diarrhea. She went to her primary care Dr. who ordered a CT scan today. CT revealed a acute appendicitis with a small bowel obstruction. Patient was sent to emergency department. Severity is moderate. Palpation makes symptoms worse.  Past Medical History  Diagnosis Date  . Chronic migraine   . Pneumonia, organism unspecified 12-25-11  . Vertigo, benign positional     Past Surgical History  Procedure Laterality Date  . Tonsillectomy and adenoidectomy    . Tonsillectomy    . Irrigation and debridement sebaceous cyst      on scalp/ 2 cysts  removed    Family History  Problem Relation Age of Onset  . Breast cancer Mother   . Cancer Father     bladder    History  Substance Use Topics  . Smoking status: Never Smoker   . Smokeless tobacco: Never Used  . Alcohol Use: 4.2 - 6 oz/week    7-10 Glasses of wine per week     Comment: 1-2 glasses of wine daily    OB History   Grav Para Term Preterm Abortions TAB SAB Ect Mult Living                  Review of Systems  All other systems reviewed and are negative.    Allergies  Biaxin  Home Medications   Current Outpatient Rx  Name  Route  Sig  Dispense  Refill  . albuterol (PROAIR HFA) 108 (90 BASE) MCG/ACT inhaler   Inhalation   Inhale 2 puffs into the lungs every 6 (six) hours as needed.         Marland Kitchen amLODipine (NORVASC) 5 MG tablet   Oral   Take 5 mg by mouth daily.         . budesonide-formoterol (SYMBICORT) 80-4.5 MCG/ACT inhaler   Inhalation   Inhale 2  puffs into the lungs 2 (two) times daily.         . Calcium Carbonate-Vit D-Min (GNP CALCIUM PLUS 600 +D PO)   Oral   Take 1 tablet by mouth 2 (two) times daily.         . Cholecalciferol (VITAMIN D-3) 1000 UNITS CAPS   Oral   Take by mouth daily.         . diphenhydrAMINE (BENADRYL) 25 mg capsule   Oral   Take 25 mg by mouth every 6 (six) hours as needed.         . Multiple Vitamin (MULTIVITAMIN) capsule   Oral   Take 1 capsule by mouth daily.         Marland Kitchen omeprazole (PRILOSEC) 20 MG capsule      Take 1 tablet 30 minutes prior to first meal of the day   30 capsule   11   . SUMAtriptan Succinate (IMITREX PO)      100 mg as needed. As needed for migraine           BP 130/50  Pulse 112  Temp(Src) 98 F (36.7 C) (Oral)  Resp 18  SpO2 96%  Physical Exam  Nursing note  and vitals reviewed. Constitutional: She is oriented to person, place, and time. She appears well-developed and well-nourished.  HENT:  Head: Normocephalic and atraumatic.  Eyes: Conjunctivae and EOM are normal. Pupils are equal, round, and reactive to light.  Neck: Normal range of motion. Neck supple.  Cardiovascular: Normal rate, regular rhythm and normal heart sounds.   Pulmonary/Chest: Effort normal and breath sounds normal.  Abdominal: Bowel sounds are normal.  Generalized lower abdominal tenderness, right greater left  Musculoskeletal: Normal range of motion.  Neurological: She is alert and oriented to person, place, and time.  Skin: Skin is warm and dry.  Psychiatric: She has a normal mood and affect.    ED Course  Procedures (including critical care time)  Labs Reviewed  CBC WITH DIFFERENTIAL  COMPREHENSIVE METABOLIC PANEL  URINALYSIS, ROUTINE W REFLEX MICROSCOPIC   No results found.   1. Appendicitis   2. Small bowel obstruction       MDM  CT scan of abdomen and pelvis reveals an  acute appendicitis with small bowel obstruction.  Consult to general surgery. Start pain  medicine and Zosyn IV.          Donnetta Hutching, MD 09/23/12 1213

## 2012-09-23 NOTE — Op Note (Signed)
09/23/2012  2:36 PM  PATIENT:  Shelia Cunningham  72 y.o. female  PRE-OPERATIVE DIAGNOSIS:  Perforated appendicitis with early abscess  POST-OPERATIVE DIAGNOSIS:  Perforated appendicitis with early abscess  PROCEDURE:  Procedure(s): APPENDECTOMY LAPAROSCOPIC, DRAINAGE INTRA-ABDOMINAL ABSCESS  SURGEON:  Surgeon(s): Liz Malady, MD  PHYSICIAN ASSISTANT:   ASSISTANTS: Myrtie Soman, RNFA   ANESTHESIA:   local and general  EBL:  Total I/O In: 1250 [I.V.:1000; IV Piggyback:250] Out: 450 [Urine:450]  BLOOD ADMINISTERED:none  DRAINS: (1) Jackson-Pratt drain(s) with closed bulb suction in the RLQ/pelvis   SPECIMEN:  Excision  DISPOSITION OF SPECIMEN:  PATHOLOGY  COUNTS:  YES  DICTATION: Reubin Milan Dictation Patient became ill 4 days ago. She was further evaluated by her primary care physician. Outpatient CT demonstrated acute appendicitis. She was referred to the emergency room where I examined her. History and physical exam are consistent with appendicitis. My review of her CT scan was consistent with perforated appendicitis with early pelvic abscess formation. She is brought for emergency appendectomy. Informed consent was obtained. Patient received intravenous antibiotics. She was brought to the operating room and general endotracheal anesthesia was administered by the anesthesia staff. Foley catheter was placed by nursing. Abdomen was prepped and draped in sterile fashion. We did a time out procedure. Infra-umbilical region was infiltrated with quarter percent Marcaine with epinephrine. Infraumbilical incision was made. Subcutaneous tissues dissected down revealing the anterior fascia. This was divided along the midline. Peritoneal cavity was entered under direct vision without difficulty. 0 Vicryl pursestring suture was placed around the fascial opening. Hassan trocar was inserted. Abdomen was insufflated with carbon dioxide in standard fashion. Under direct vision, a 12 mm left  lower corner and a 5 mm right mid abdomen port were placed. Local was used at each port site. Laparoscopic exploration revealed dilated small bowel consistent with ileus. Several loops of small bowel were stuck down into the pelvis overlying an area of inflammation. These were gently mobilized without complication. This revealed a abscess cavity in the pelvis this arose from perforated appendicitis. The mid body of the appendix was completely perforated and served as the source of the abscess. The appendix was mobilized from the abscess area and the surrounding small bowel. It was retracted superiorly. The mesoappendix was divided with the harmonic scalpel achieving excellent hemostasis. The base of the appendix was then divided with Endo GIA with a vascular load. A good staple closure was achieved. It appeared the distal 30-40% of the appendix was gone. The appendix was placed in an Endo Catch bag and removed from the abdomen via the left lower quadrant port site. Pelvic abscess was copiously irrigated. No residual portions of the appendix were were identified. The area of inflammation was copiously irrigated with 4 L of saline. A 19 [place] Blake drain was placed into the abscess area. The staple line was rechecked and was intact. There was no bleeding. Irrigation fluid was evacuated and it was clear. Ports were removed under direct vision. Pneumoperitoneum was released. Infraumbilical fascia was closed by tying the pursestring suture with care not to trap the intra-abdominal contents. 2 remaining wounds were closed with running 4 Vicryl subcuticular followed by Dermabond. Nylon drain stitch was placed after positioning the drain previously. All counts were correct. Patient tolerated procedure well without apparent complication was taken recovery in stable condition.  PATIENT DISPOSITION:  PACU - hemodynamically stable.   Delay start of Pharmacological VTE agent (>24hrs) due to surgical blood loss or risk of  bleeding:  no  Violeta Gelinas, MD, MPH, FACS Pager: [phone]  5/16/20142:36 PM

## 2012-09-23 NOTE — ED Notes (Addendum)
Will Marlyne Beards, PA at the bedside.

## 2012-09-23 NOTE — ED Notes (Signed)
Pt sent by PCP for eval for appendicitis and SBO; pt with disc in hand; pt c/o generalized abd pain x 3 days

## 2012-09-23 NOTE — ED Notes (Signed)
Pt c/o fever, pain, nausea and diarrhea since Monday.

## 2012-09-23 NOTE — H&P (Signed)
See my previous note. Patient examined and I agree with the assessment and plan  Violeta Gelinas, MD, MPH, FACS Pager: [phone]  09/23/2012 3:32 PM

## 2012-09-23 NOTE — Transfer of Care (Signed)
Immediate Anesthesia Transfer of Care Note  Patient: Shelia Cunningham  Procedure(s) Performed: Procedure(s): APPENDECTOMY LAPAROSCOPIC (N/A)  Patient Location: PACU  Anesthesia Type:General  Level of Consciousness: awake, alert , oriented and patient cooperative  Airway & Oxygen Therapy: Patient Spontanous Breathing and Patient connected to nasal cannula oxygen  Post-op Assessment: Report given to PACU RN, Post -op Vital signs reviewed and stable and Patient moving all extremities  Post vital signs: Reviewed and stable  Complications: No apparent anesthesia complications

## 2012-09-23 NOTE — Anesthesia Postprocedure Evaluation (Signed)
Anesthesia Post Note  Patient: Shelia Cunningham  Procedure(s) Performed: Procedure(s) (LRB): APPENDECTOMY LAPAROSCOPIC (N/A)  Anesthesia type: general  Patient location: PACU  Post pain: Pain level controlled  Post assessment: Patient's Cardiovascular Status Stable  Last Vitals:  Filed Vitals:   09/23/12 1451  BP: 136/57  Pulse: 81  Temp:   Resp: 17    Post vital signs: Reviewed and stable  Level of consciousness: sedated  Complications: No apparent anesthesia complications

## 2012-09-24 DIAGNOSIS — K651 Peritoneal abscess: Secondary | ICD-10-CM | POA: Diagnosis not present

## 2012-09-24 DIAGNOSIS — R5381 Other malaise: Secondary | ICD-10-CM | POA: Diagnosis not present

## 2012-09-24 DIAGNOSIS — R404 Transient alteration of awareness: Secondary | ICD-10-CM | POA: Diagnosis not present

## 2012-09-24 DIAGNOSIS — K560 Paralytic ileus: Secondary | ICD-10-CM | POA: Diagnosis not present

## 2012-09-24 DIAGNOSIS — K352 Acute appendicitis with generalized peritonitis, without abscess: Secondary | ICD-10-CM | POA: Diagnosis not present

## 2012-09-24 DIAGNOSIS — E871 Hypo-osmolality and hyponatremia: Secondary | ICD-10-CM

## 2012-09-24 MED ORDER — WHITE PETROLATUM GEL
Status: AC
  Administered 2012-09-24: 0.2
  Filled 2012-09-24: qty 5

## 2012-09-24 MED ORDER — PROMETHAZINE HCL 25 MG/ML IJ SOLN
12.5000 mg | Freq: Four times a day (QID) | INTRAMUSCULAR | Status: DC | PRN
  Administered 2012-09-24 – 2012-09-25 (×2): 12.5 mg via INTRAVENOUS
  Filled 2012-09-24 (×2): qty 1

## 2012-09-24 MED ORDER — FENTANYL CITRATE 0.05 MG/ML IJ SOLN
25.0000 ug | INTRAMUSCULAR | Status: DC | PRN
  Administered 2012-09-24 – 2012-09-29 (×10): 25 ug via INTRAVENOUS
  Filled 2012-09-24 (×11): qty 2

## 2012-09-24 MED ORDER — ACETAMINOPHEN 10 MG/ML IV SOLN
1000.0000 mg | Freq: Four times a day (QID) | INTRAVENOUS | Status: AC
  Administered 2012-09-24 – 2012-09-25 (×4): 1000 mg via INTRAVENOUS
  Filled 2012-09-24 (×4): qty 100

## 2012-09-24 MED ORDER — DEXTROSE-NACL 5-0.9 % IV SOLN
INTRAVENOUS | Status: DC
  Administered 2012-09-24 – 2012-09-30 (×13): via INTRAVENOUS
  Filled 2012-09-24 (×17): qty 1000

## 2012-09-24 NOTE — Progress Notes (Addendum)
1 Day Post-Op  Subjective: Nauseated, did not sleep, morphine caused hallucinations  Objective: Vital signs in last 24 hours: Temp:  [96.9 F (36.1 C)-100.8 F (38.2 C)] 98.8 F (37.1 C) (05/17 0536) Pulse Rate:  [69-112] 95 (05/17 0536) Resp:  [15-24] 16 (05/17 0536) BP: (107-141)/(50-67) 123/60 mmHg (05/17 0536) SpO2:  [92 %-100 %] 97 % (05/17 0536) Weight:  [61.236 kg (135 lb)] 61.236 kg (135 lb) (05/16 1620) Last BM Date: 09/23/12  Intake/Output from previous day: 05/16 0701 - 05/17 0700 In: 1495 [I.V.:1000; IV Piggyback:375] Out: 1441 [Urine:1025; Drains:416] Intake/Output this shift: Total I/O In: -  Out: 300 [Emesis/NG output:300]  General appearance: alert and cooperative Resp: clear to auscultation bilaterally Cardio: regular rate and rhythm GI: soft, distended, quiet, JP purulent output Neurologic: Mental status: Alert, oriented, thought content appropriate Ext: PAS  Lab Results:   Recent Labs  09/23/12 1124  WBC 12.5*  HGB 14.4  HCT 41.3  PLT 270   BMET  Recent Labs  09/23/12 1124  NA 133*  K 3.5  CL 94*  CO2 25  GLUCOSE 123*  BUN 13  CREATININE 0.72  CALCIUM 9.3   PT/INR No results found for this basename: LABPROT, INR,  in the last 72 hours ABG No results found for this basename: PHART, PCO2, PO2, HCO3,  in the last 72 hours  Studies/Results: No results found.  Anti-infectives: Anti-infectives   Start     Dose/Rate Route Frequency Ordered Stop   09/23/12 2000  piperacillin-tazobactam (ZOSYN) IVPB 3.375 g     3.375 g 12.5 mL/hr over 240 Minutes Intravenous 3 times per day 09/23/12 1229     09/23/12 1215  piperacillin-tazobactam (ZOSYN) IVPB 3.375 g     3.375 g 100 mL/hr over 30 Minutes Intravenous  Once 09/23/12 1209 09/23/12 1308      Assessment/Plan: s/p Procedure(s): APPENDECTOMY LAPAROSCOPIC (N/A) S/P lap appy and drainage intra-abdominal abscess POD#1 ID - zosyn Ileus - sips clears Hyponatremia - adjust IVF Delirium  with morphine - try IV tylenol and low dose fentanyl VTE - PAS, SQHep  LOS: 1 day    Yoshie Kosel E 09/24/2012

## 2012-09-25 DIAGNOSIS — E871 Hypo-osmolality and hyponatremia: Secondary | ICD-10-CM | POA: Diagnosis not present

## 2012-09-25 DIAGNOSIS — K560 Paralytic ileus: Secondary | ICD-10-CM | POA: Diagnosis not present

## 2012-09-25 DIAGNOSIS — R404 Transient alteration of awareness: Secondary | ICD-10-CM | POA: Diagnosis not present

## 2012-09-25 DIAGNOSIS — R5381 Other malaise: Secondary | ICD-10-CM

## 2012-09-25 DIAGNOSIS — K651 Peritoneal abscess: Secondary | ICD-10-CM | POA: Diagnosis not present

## 2012-09-25 DIAGNOSIS — K352 Acute appendicitis with generalized peritonitis, without abscess: Secondary | ICD-10-CM | POA: Diagnosis not present

## 2012-09-25 LAB — BASIC METABOLIC PANEL
GFR calc Af Amer: >90 mL/min (ref >90)
GFR calc non Af Amer: 84 mL/min — ABNORMAL LOW (ref >90)
Glucose, Bld: 163 mg/dL — ABNORMAL HIGH (ref 70–99)
Potassium: 3.5 mEq/L (ref 3.5–5.1)
Sodium: 137 mEq/L (ref 135–145)

## 2012-09-25 LAB — CBC
Hemoglobin: 12.5 g/dL (ref 12.0–15.0)
MCHC: 34.3 g/dL (ref 30.0–36.0)
RDW: 13.8 % (ref 11.5–15.5)

## 2012-09-25 MED ORDER — ZOLPIDEM TARTRATE 5 MG PO TABS: 5.0000 mg | ORAL_TABLET | Freq: Every evening | ORAL | Status: DC | PRN

## 2012-09-25 MED ORDER — BENZOCAINE 20 % MT SOLN
Freq: Three times a day (TID) | OROMUCOSAL | Status: DC | PRN
  Administered 2012-09-25: 1 via OROMUCOSAL
  Filled 2012-09-25 (×2): qty 57

## 2012-09-25 MED ORDER — DIPHENHYDRAMINE HCL 50 MG/ML IJ SOLN: 12.5000 mg | Freq: Every evening | INTRAMUSCULAR | Status: DC | PRN

## 2012-09-25 MED ORDER — ACETAMINOPHEN 10 MG/ML IV SOLN
1000.0000 mg | Freq: Four times a day (QID) | INTRAVENOUS | Status: AC
  Administered 2012-09-25 – 2012-09-26 (×4): 1000 mg via INTRAVENOUS
  Filled 2012-09-25 (×5): qty 100

## 2012-09-25 NOTE — Progress Notes (Signed)
2 Days Post-Op  Subjective: More N/V overnight  Objective: Vital signs in last 24 hours: Temp:  [98.1 F (36.7 C)-98.8 F (37.1 C)] 98.1 F (36.7 C) (05/18 0530) Pulse Rate:  [84-98] 98 (05/18 0530) Resp:  [16-18] 18 (05/18 0530) BP: (124-138)/(46-56) 138/56 mmHg (05/18 0530) SpO2:  [99 %-100 %] 99 % (05/18 0530) Last BM Date: 09/23/12  Intake/Output from previous day: 05/17 0701 - 05/18 0700 In: 4251.7 [P.O.:180; I.V.:3621.7; IV Piggyback:450] Out: 2905 [Urine:850; Emesis/NG output:1750; Drains:305] Intake/Output this shift:    General appearance: alert and cooperative Resp: clear to auscultation bilaterally Cardio: regular rate and rhythm GI: soft, distended, expected tenderness at incisions, JP SS, + a few BS Neurologic: Mental status: Alert, oriented, thought content appropriate  Lab Results:   Recent Labs  09/23/12 1124 09/25/12 0520  WBC 12.5* 7.6  HGB 14.4 12.5  HCT 41.3 36.4  PLT 270 322   BMET  Recent Labs  09/23/12 1124 09/25/12 0520  NA 133* 137  K 3.5 3.5  CL 94* 100  CO2 25 28  GLUCOSE 123* 163*  BUN 13 12  CREATININE 0.72 0.71  CALCIUM 9.3 7.6*   PT/INR No results found for this basename: LABPROT, INR,  in the last 72 hours ABG No results found for this basename: PHART, PCO2, PO2, HCO3,  in the last 72 hours  Studies/Results: No results found.  Anti-infectives: Anti-infectives   Start     Dose/Rate Route Frequency Ordered Stop   09/23/12 2000  piperacillin-tazobactam (ZOSYN) IVPB 3.375 g     3.375 g 12.5 mL/hr over 240 Minutes Intravenous 3 times per day 09/23/12 1229     09/23/12 1215  piperacillin-tazobactam (ZOSYN) IVPB 3.375 g     3.375 g 100 mL/hr over 30 Minutes Intravenous  Once 09/23/12 1209 09/23/12 1308      Assessment/Plan: s/p Procedure(s): APPENDECTOMY LAPAROSCOPIC (N/A) S/P lap appy and drainage intra-abdominal abscess POD#2 ID - zosyn, WBC WNL Ileus - place NGT Hyponatremia - better after adjustment in  IVF Delirium with morphine -  IV tylenol and low dose fentanyl VTE - PAS, SQHep Deconditioning - PT/OT I also spoke to her daughter at the bedside at length regarding the plan of care  LOS: 2 days    Yatzary Merriweather E 09/25/2012

## 2012-09-25 NOTE — Evaluation (Signed)
Physical Therapy Evaluation Patient Details Name: Shelia Cunningham MRN: [id number] DOB: 10-20-1940 Today's Date: 09/25/2012 Time: 1324-4010 PT Time Calculation (min): 34 min  PT Assessment / Plan / Recommendation Clinical Impression  Pt is 72 y/o female admitted for abdominal pain resulting in s/p appendectomy and NG tube.  Pt limited due to overall fatigue and generalized weakness.  Pt will benefit from acute PT services to improve overall mobility and prepare for safe d/c home with spouse.    PT Assessment  Patient needs continued PT services    Follow Up Recommendations  Other (comment);No PT follow up (will continue to assess depending progress)    Barriers to Discharge None      Equipment Recommendations  None recommended by PT    Recommendations for Other Services     Frequency Min 3X/week    Precautions / Restrictions Precautions Precautions: Fall Restrictions Weight Bearing Restrictions: No   Pertinent Vitals/Pain Mild c/o pain but does not rate       Mobility  Bed Mobility Bed Mobility: Right Sidelying to Sit;Supine to Sit Right Sidelying to Sit: 4: Min guard;With rails;HOB flat Supine to Sit: 4: Min assist;With rails Details for Bed Mobility Assistance: (A) to elevate trunk OOB with cues for hand placement Transfers Transfers: Sit to Stand;Stand to Sit Sit to Stand: 4: Min assist;From bed Stand to Sit: 4: Min assist;To chair/3-in-1 Details for Transfer Assistance: (A) to initiate transfer and maintain balance Ambulation/Gait Ambulation/Gait Assistance: 4: Min assist Ambulation Distance (Feet): 5 Feet Assistive device: None Ambulation/Gait Assistance Details: Limited ambulation due to overall fatigue and NG tube unable to connect Gait Pattern: Step-to pattern;Decreased stride length;Shuffle Gait velocity: slow Stairs: No    Exercises     PT Diagnosis: Generalized weakness  PT Problem List: Decreased activity tolerance;Decreased mobility;Decreased  knowledge of use of DME;Pain PT Treatment Interventions: Gait training;Stair training;Functional mobility training;Therapeutic activities;Therapeutic exercise;Patient/family education   PT Goals Acute Rehab PT Goals PT Goal Formulation: With patient Time For Goal Achievement: 10/02/12 Potential to Achieve Goals: Good Pt will go Supine/Side to Sit: Independently PT Goal: Supine/Side to Sit - Progress: Goal set today Pt will go Sit to Supine/Side: Independently PT Goal: Sit to Supine/Side - Progress: Goal set today Pt will go Sit to Stand: with modified independence PT Goal: Sit to Stand - Progress: Goal set today Pt will go Stand to Sit: with modified independence PT Goal: Stand to Sit - Progress: Goal set today Pt will Ambulate: >150 feet;with modified independence;with least restrictive assistive device PT Goal: Ambulate - Progress: Goal set today Pt will Go Up / Down Stairs: 3-5 stairs;with modified independence;with least restrictive assistive device PT Goal: Up/Down Stairs - Progress: Goal set today  Visit Information  Last PT Received On: 09/25/12 Assistance Needed: +1    Subjective Data  Subjective: "I'm just so weak." Patient Stated Goal: To go home; to get back to ballroom dancing   Prior Functioning  Home Living Lives With: Spouse Available Help at Discharge: Family Type of Home: House Home Access: Stairs to enter Secretary/administrator of Steps: 3 Entrance Stairs-Rails: None Home Layout: Two level;Able to live on main level with bedroom/bathroom Bathroom Shower/Tub: Engineer, manufacturing systems: Standard Bathroom Accessibility: Yes How Accessible: Accessible via walker Home Adaptive Equipment: None Prior Function Level of Independence: Independent Able to Take Stairs?: Yes Driving: Yes Communication Communication: No difficulties Dominant Hand: Right    Cognition  Cognition Arousal/Alertness: Awake/alert Behavior During Therapy: WFL for tasks  assessed/performed Overall Cognitive Status: Within  Functional Limits for tasks assessed    Extremity/Trunk Assessment Right Lower Extremity Assessment RLE ROM/Strength/Tone: Within functional levels Left Lower Extremity Assessment LLE ROM/Strength/Tone: Within functional levels   Balance    End of Session PT - End of Session Equipment Utilized During Treatment: Gait belt Activity Tolerance: Patient limited by fatigue Patient left: in chair;with call bell/phone within reach;with nursing in room Nurse Communication: Mobility status  GP     Ryota Treece 09/25/2012, 4:30 PM Jake Shark, PT DPT [phone]

## 2012-09-26 ENCOUNTER — Encounter (HOSPITAL_COMMUNITY): Payer: Self-pay | Admitting: General Surgery

## 2012-09-26 LAB — BASIC METABOLIC PANEL
CO2: 26 mEq/L (ref 19–32)
Chloride: 106 mEq/L (ref 96–112)
Glucose, Bld: 120 mg/dL — ABNORMAL HIGH (ref 70–99)
Potassium: 3.8 mEq/L (ref 3.5–5.1)
Sodium: 138 mEq/L (ref 135–145)

## 2012-09-26 LAB — CBC
Hemoglobin: 11.5 g/dL — ABNORMAL LOW (ref 12.0–15.0)
RBC: 3.94 MIL/uL (ref 3.87–5.11)
WBC: 12.1 10*3/uL — ABNORMAL HIGH (ref 4.0–10.5)

## 2012-09-26 MED ORDER — SODIUM CHLORIDE 0.9 % IV BOLUS (SEPSIS)
500.0000 mL | Freq: Once | INTRAVENOUS | Status: AC
  Administered 2012-09-26: 500 mL via INTRAVENOUS

## 2012-09-26 NOTE — Evaluation (Signed)
Occupational Therapy Evaluation Patient Details Name: Shelia Cunningham MRN: [id number] DOB: 01-05-1941 Today's Date: 09/26/2012 Time: 4098-1191 OT Time Calculation (min): 62 min  OT Assessment / Plan / Recommendation Clinical Impression   This 72 y.o. Female admitted with appendicitis, underwent appendectomy, and developed post op ileus.  Pt incontinent large amount of loose stool with OT, while standing.  Pt. Presents to OT with the below listed deficits and will benefit from acute OT to maximize safety and independence with BADLs to allow her to return home modified independently with spouse    OT Assessment  Patient needs continued OT Services    Follow Up Recommendations  No OT follow up;Supervision/Assistance - 24 hour    Barriers to Discharge None    Equipment Recommendations  None recommended by OT    Recommendations for Other Services    Frequency  Min 2X/week    Precautions / Restrictions Precautions Precautions: Fall Restrictions Weight Bearing Restrictions: No   Pertinent Vitals/Pain     ADL  Eating/Feeding: NPO Grooming: Wash/dry hands;Teeth care;Min guard Where Assessed - Grooming: Unsupported standing Upper Body Bathing: Minimal assistance Where Assessed - Upper Body Bathing: Unsupported sitting Lower Body Bathing: Moderate assistance Where Assessed - Lower Body Bathing: Supported sit to stand Upper Body Dressing: Minimal assistance Where Assessed - Upper Body Dressing: Unsupported sitting Lower Body Dressing: Minimal assistance Where Assessed - Lower Body Dressing: Supported sit to stand Toilet Transfer: Hydrographic surveyor Method: Sit to stand;Stand pivot Acupuncturist: Bedside commode;Comfort height toilet Toileting - Architect and Hygiene: Min guard Where Assessed - Engineer, mining and Hygiene: Standing Equipment Used: Rolling walker Transfers/Ambulation Related to ADLs: min guard assistq ADL Comments: Pt  stood at sink to brush teeth, and proceeded to be incontinent of bowel.  Pt. moved to [facility] and assisted with clean up/bathing.      OT Diagnosis: Generalized weakness;Acute pain  OT Problem List: Decreased strength;Decreased activity tolerance;Decreased knowledge of use of DME or AE;Pain OT Treatment Interventions: Self-care/ADL training;DME and/or AE instruction;Therapeutic activities;Patient/family education;Balance training   OT Goals Acute Rehab OT Goals OT Goal Formulation: With patient Time For Goal Achievement: 10/03/12 Potential to Achieve Goals: Good ADL Goals Pt Will Perform Grooming: with modified independence;Unsupported;Standing at sink ADL Goal: Grooming - Progress: Goal set today Pt Will Perform Upper Body Bathing: with modified independence;Sitting at sink;Sitting, chair ADL Goal: Upper Body Bathing - Progress: Goal set today Pt Will Perform Lower Body Bathing: with modified independence;Sit to stand from chair;Sit to stand from bed ADL Goal: Lower Body Bathing - Progress: Goal set today Pt Will Perform Upper Body Dressing: with modified independence;Sitting, chair;Sitting, bed ADL Goal: Upper Body Dressing - Progress: Goal set today Pt Will Perform Lower Body Dressing: with modified independence;Sit to stand from chair;Sit to stand from bed ADL Goal: Lower Body Dressing - Progress: Goal set today Pt Will Transfer to Toilet: with modified independence;Ambulation;Comfort height toilet ADL Goal: Toilet Transfer - Progress: Goal set today Pt Will Perform Toileting - Clothing Manipulation: with modified independence;Standing ADL Goal: Toileting - Clothing Manipulation - Progress: Goal set today Pt Will Perform Toileting - Hygiene: with modified independence;Sitting on 3-in-1 or toilet ADL Goal: Toileting - Hygiene - Progress: Goal set today Pt Will Perform Tub/Shower Transfer: with modified independence;Ambulation;with DME ADL Goal: Tub/Shower Transfer - Progress: Goal set  today  Visit Information  Last OT Received On: 09/26/12 Assistance Needed: +1    Subjective Data  Subjective: "I feel so weak" Patient Stated Goal: To  dance at her 50th wedding anniversary party   Prior Functioning     Home Living Lives With: Spouse Available Help at Discharge: Family Type of Home: House Home Access: Stairs to enter [employer] of Steps: 3 Entrance Stairs-Rails: None Home Layout: Two level;Able to live on main level with bedroom/bathroom Bathroom Shower/Tub: Engineer, manufacturing systems: Standard Bathroom Accessibility: Yes How Accessible: Accessible via walker Home Adaptive Equipment: None Prior Function Level of Independence: Independent Able to Take Stairs?: Yes Driving: Yes Communication Communication: No difficulties Dominant Hand: Right         Vision/Perception Vision - History Baseline Vision: No visual deficits   Cognition  Cognition Arousal/Alertness: Awake/alert Behavior During Therapy: WFL for tasks assessed/performed Overall Cognitive Status: Within Functional Limits for tasks assessed    Extremity/Trunk Assessment Right Upper Extremity Assessment RUE ROM/Strength/Tone: Within functional levels RUE Coordination: WFL - gross/fine motor Left Upper Extremity Assessment LUE ROM/Strength/Tone: Within functional levels LUE Coordination: WFL - gross/fine motor Trunk Assessment Trunk Assessment: Normal     Mobility Bed Mobility Bed Mobility: Rolling Left;Left Sidelying to Sit;Sitting - Scoot to Edge of Bed;Sit to Supine Rolling Left: 5: Supervision;With rail Left Sidelying to Sit: 5: Supervision;With rails;HOB flat Sitting - Scoot to Edge of Bed: 5: Supervision Sit to Supine: 5: Supervision;With rail;HOB flat Transfers Transfers: Sit to Stand;Stand to Sit Sit to Stand: 4: Min guard;With upper extremity assist;From bed;From chair/3-in-1 Stand to Sit: 4: Min guard;With upper extremity assist;To bed;To  chair/3-in-1 Details for Transfer Assistance: vc's for hand placement.  Pt complaint of dizziness with activity     Exercise     Balance Balance Balance Assessed: Yes Dynamic Standing Balance Dynamic Standing - Balance Support: No upper extremity supported Dynamic Standing - Level of Assistance: Other (comment) (min guard assist) Dynamic Standing - Balance Activities:  (ADL)   End of Session OT - End of Session Activity Tolerance: Patient tolerated treatment well Patient left: in bed;with call bell/phone within reach Nurse Communication: Patient requests pain meds;Other (comment) (incontinent of bowel)  GO     Erasmo Vertz M 09/26/2012, 12:41 PM

## 2012-09-26 NOTE — Progress Notes (Signed)
3 Days Post-Op  Subjective: No real flatus or bm yet, feels better with ng in  Objective: Vital signs in last 24 hours: Temp:  [98.2 F (36.8 C)-98.6 F (37 C)] 98.4 F (36.9 C) (05/19 0510) Pulse Rate:  [87-91] 87 (05/19 0510) Resp:  [16] 16 (05/19 0510) BP: (116-123)/(43-52) 116/52 mmHg (05/19 0510) SpO2:  [95 %] 95 % (05/19 0510) Last BM Date: 09/25/12  Intake/Output from previous day: 05/18 0701 - 05/19 0700 In: 2805 [I.V.:2578; IV Piggyback:227] Out: 3315 [Urine:550; Emesis/NG output:2550; Drains:215] Intake/Output this shift:    General appearance: no distress Resp: diminished breath sounds bibasilar Cardio: regular rate and rhythm GI: drain with serous fluid, approp tender, scant bs, wounds clean  Lab Results:   Recent Labs  09/25/12 0520 09/26/12 0540  WBC 7.6 12.1*  HGB 12.5 11.5*  HCT 36.4 34.8*  PLT 322 349   BMET  Recent Labs  09/25/12 0520 09/26/12 0540  NA 137 138  K 3.5 3.8  CL 100 106  CO2 28 26  GLUCOSE 163* 120*  BUN 12 11  CREATININE 0.71 0.60  CALCIUM 7.6* 7.0*    Anti-infectives: Anti-infectives   Start     Dose/Rate Route Frequency Ordered Stop   09/23/12 2000  piperacillin-tazobactam (ZOSYN) IVPB 3.375 g     3.375 g 12.5 mL/hr over 240 Minutes Intravenous 3 times per day 09/23/12 1229     09/23/12 1215  piperacillin-tazobactam (ZOSYN) IVPB 3.375 g     3.375 g 100 mL/hr over 30 Minutes Intravenous  Once 09/23/12 1209 09/23/12 1308      Assessment/Plan: POD 3 lap appy, drainage pelvic abscess 1. Continue fentanyl for pain 2. Await ileus resolve, cont ng tube, npo for now 3. Monitor wbc and will continue zosyn, will need total 5-7 days abx 4. Sq heparin, pulm toilet, scds  [facility] 09/26/2012

## 2012-09-26 NOTE — Progress Notes (Signed)
Courtesy Visit- I greatly appreciate prompt General Surgery management of this very nice patient with an atypical presentation of Appendicitis with perforation.  She if feeling better postoperatively after NG tube placement with less nausea.  Abdominal pain is controlled.  Hallucinations resolved with change from MSO4 to Fentanyl.  Bowel sounds are scant but +flatus.  Encouragement provided.  Patient motivated to dance at her 49th anniversary in 7/14!  I am available if needed- will follow peripherally.

## 2012-09-26 NOTE — Progress Notes (Signed)
Physical Therapy Treatment Patient Details Name: Shelia Cunningham MRN: [id number] DOB: [birth year] Today's Date: 09/26/2012 Time: 3086-5784 PT Time Calculation (min): 17 min  PT Assessment / Plan / Recommendation Comments on Treatment Session  Patient required some encouragement to participate in therapy this afternoon. After some education on benefits, patient agreeable to ambulation in room and sitting up in the recliner. Will attempt to try hallway ambulation next session    Follow Up Recommendations  Other (comment);No PT follow up     Does the patient have the potential to tolerate intense rehabilitation     Barriers to Discharge        Equipment Recommendations  None recommended by PT    Recommendations for Other Services    Frequency Min 3X/week   Plan Discharge plan remains appropriate;Frequency remains appropriate    Precautions / Restrictions Precautions Precautions: Fall Restrictions Weight Bearing Restrictions: No   Pertinent Vitals/Pain 7/10 stomach pain. Premedicated    Mobility  Bed Mobility Bed Mobility: Rolling Left;Left Sidelying to Sit;Sitting - Scoot to Edge of Bed;Sit to Supine Rolling Left: 5: Supervision;With rail Left Sidelying to Sit: 5: Supervision;With rails;HOB flat Supine to Sit: 5: Supervision;With rails Sitting - Scoot to Edge of Bed: 5: Supervision Sit to Supine: 5: Supervision;With rail;HOB flat Details for Bed Mobility Assistance: no cues required.  Transfers Sit to Stand: 4: Min guard;With upper extremity assist;From bed Stand to Sit: 4: Min guard;With upper extremity assist;To chair/3-in-1 Details for Transfer Assistance: vc's for hand placement.  MinGuard for safety Ambulation/Gait Ambulation/Gait Assistance: 4: Min assist Ambulation Distance (Feet): 30 Feet Assistive device: Rolling walker Ambulation/Gait Assistance Details: Patient agreeable to ambulate in room but not out to hallway. Cues for RW management.  Gait Pattern: Step-to  pattern;Decreased stride length;Shuffle Gait velocity: slow    Exercises     PT Diagnosis:    PT Problem List:   PT Treatment Interventions:     PT Goals Acute Rehab PT Goals PT Goal: Supine/Side to Sit - Progress: Progressing toward goal PT Goal: Sit to Stand - Progress: Progressing toward goal PT Goal: Stand to Sit - Progress: Progressing toward goal PT Goal: Ambulate - Progress: Progressing toward goal  Visit Information  Last PT Received On: 09/26/12 Assistance Needed: +1    Subjective Data      Cognition  Cognition Arousal/Alertness: Awake/alert Behavior During Therapy: WFL for tasks assessed/performed Overall Cognitive Status: Within Functional Limits for tasks assessed    Balance  Balance Balance Assessed: Yes Dynamic Standing Balance Dynamic Standing - Balance Support: No upper extremity supported Dynamic Standing - Level of Assistance: Other (comment) (min guard assist) Dynamic Standing - Balance Activities:  (ADL)  End of Session PT - End of Session Activity Tolerance: Patient tolerated treatment well;Patient limited by fatigue Patient left: in chair;with call bell/phone within reach Nurse Communication: Mobility status   GP     Fredrich Birks 09/26/2012, 2:42 PM 09/26/2012 Fredrich Birks PTA [phone] pager [phone] office

## 2012-09-27 LAB — CBC
Hemoglobin: 11.6 g/dL — ABNORMAL LOW (ref 12.0–15.0)
MCH: 29.8 pg (ref 26.0–34.0)
MCV: 87.9 fL (ref 78.0–100.0)
RBC: 3.89 MIL/uL (ref 3.87–5.11)

## 2012-09-27 LAB — BASIC METABOLIC PANEL
CO2: 26 mEq/L (ref 19–32)
Calcium: 7 mg/dL — ABNORMAL LOW (ref 8.4–10.5)
Chloride: 113 mEq/L — ABNORMAL HIGH (ref 96–112)
Creatinine, Ser: 0.56 mg/dL (ref 0.50–1.10)
Glucose, Bld: 131 mg/dL — ABNORMAL HIGH (ref 70–99)

## 2012-09-27 MED ORDER — PHENOL 1.4 % MT LIQD
1.0000 | OROMUCOSAL | Status: DC | PRN
  Filled 2012-09-27: qty 177

## 2012-09-27 MED ORDER — MENTHOL 3 MG MT LOZG
1.0000 | LOZENGE | OROMUCOSAL | Status: DC | PRN
  Filled 2012-09-27: qty 9

## 2012-09-27 NOTE — Progress Notes (Signed)
Patient ID: Shelia Cunningham, female   DOB: Feb 25, 1941, 72 y.o.   MRN: [id number] 4 Days Post-Op  Subjective: Pt conts to have diarrhea.  Tired, doesn't want to do PT today  Objective: Vital signs in last 24 hours: Temp:  [99.7 F (37.6 C)-100 F (37.8 C)] 99.9 F (37.7 C) (05/20 0559) Pulse Rate:  [91-98] 98 (05/20 0559) Resp:  [16-18] 16 (05/20 0559) BP: (109-122)/(31-78) 122/39 mmHg (05/20 0559) SpO2:  [96 %-98 %] 98 % (05/20 0559) Last BM Date: 09/26/12  Intake/Output from previous day: 05/19 0701 - 05/20 0700 In: 2765 [P.O.:120; I.V.:2395; IV Piggyback:250] Out: 1345 [Emesis/NG output:1000; Drains:345] Intake/Output this shift:    PE: Abd: soft, -BS, NGT with some bilious output, ND, incisions c/d/i  Lab Results:   Recent Labs  09/26/12 0540 09/27/12 0510  WBC 12.1* 17.7*  HGB 11.5* 11.6*  HCT 34.8* 34.2*  PLT 349 360   BMET  Recent Labs  09/26/12 0540 09/27/12 0510  NA 138 145  K 3.8 4.1  CL 106 113*  CO2 26 26  GLUCOSE 120* 131*  BUN 11 10  CREATININE 0.60 0.56  CALCIUM 7.0* 7.0*   PT/INR No results found for this basename: LABPROT, INR,  in the last 72 hours CMP     Component Value Date/Time   NA 145 09/27/2012 0510   K 4.1 09/27/2012 0510   CL 113* 09/27/2012 0510   CO2 26 09/27/2012 0510   GLUCOSE 131* 09/27/2012 0510   BUN 10 09/27/2012 0510   CREATININE 0.56 09/27/2012 0510   CALCIUM 7.0* 09/27/2012 0510   PROT 8.0 09/23/2012 1124   ALBUMIN 3.3* 09/23/2012 1124   AST 19 09/23/2012 1124   ALT 24 09/23/2012 1124   ALKPHOS 95 09/23/2012 1124   BILITOT 0.4 09/23/2012 1124   GFRNONAA >90 09/27/2012 0510   GFRAA >90 09/27/2012 0510   Lipase  No results found for this basename: lipase       Studies/Results: No results found.  Anti-infectives: Anti-infectives   Start     Dose/Rate Route Frequency Ordered Stop   09/23/12 2000  piperacillin-tazobactam (ZOSYN) IVPB 3.375 g     3.375 g 12.5 mL/hr over 240 Minutes Intravenous 3 times per day  09/23/12 1229     09/23/12 1215  piperacillin-tazobactam (ZOSYN) IVPB 3.375 g     3.375 g 100 mL/hr over 30 Minutes Intravenous  Once 09/23/12 1209 09/23/12 1308       Assessment/Plan  1. S/p lap appy for perf appy 2. Diarrhea 3. Leukocytosis  Plan: 1. Only wants to work with PT in her room today! 2. Will follow WBC 3. If continues to have diarrhea and elevated WBC, may check c diff tomorrow, but doubt this is c diff. 4. Clamp NGT and remove in 6 hrs if tolerates   LOS: 4 days    Biruk Troia E 09/27/2012, 10:57 AM Pager: [phone]

## 2012-09-27 NOTE — Progress Notes (Signed)
Feels worn out, now having diarrhea, will check c diff today due to wbc, i think can clamp ng and hopefully remove later though.  Cont npo, abx, recheck wbc in am

## 2012-09-28 LAB — BASIC METABOLIC PANEL
CO2: 21 mEq/L (ref 19–32)
Chloride: 112 mEq/L (ref 96–112)
GFR calc Af Amer: >90 mL/min (ref >90)
Potassium: 3.8 mEq/L (ref 3.5–5.1)
Sodium: 142 mEq/L (ref 135–145)

## 2012-09-28 LAB — CLOSTRIDIUM DIFFICILE BY PCR: Toxigenic C. Difficile by PCR: NEGATIVE

## 2012-09-28 LAB — CBC
Platelets: 420 10*3/uL — ABNORMAL HIGH (ref 150–400)
RBC: 3.79 MIL/uL — ABNORMAL LOW (ref 3.87–5.11)
RDW: 14.4 % (ref 11.5–15.5)
WBC: 13.5 10*3/uL — ABNORMAL HIGH (ref 4.0–10.5)

## 2012-09-28 MED ORDER — MAGNESIUM SULFATE 50 % IJ SOLN: 1.0000 g | Freq: Once | INTRAMUSCULAR | Status: DC

## 2012-09-28 MED ORDER — SODIUM CHLORIDE 0.9 % IV SOLN
1.0000 g | Freq: Once | INTRAVENOUS | Status: AC
  Administered 2012-09-28: 1 g via INTRAVENOUS
  Filled 2012-09-28: qty 10

## 2012-09-28 MED ORDER — MAGNESIUM SULFATE IN D5W 10-5 MG/ML-% IV SOLN
1.0000 g | Freq: Once | INTRAVENOUS | Status: AC
  Administered 2012-09-28: 1 g via INTRAVENOUS
  Filled 2012-09-28: qty 100

## 2012-09-28 NOTE — Progress Notes (Signed)
5 Days Post-Op  Subjective: Still with significant diarrhea Tolerating NG out No nausea or bloating  Objective: Vital signs in last 24 hours: Temp:  [98.4 F (36.9 C)-99.1 F (37.3 C)] 99.1 F (37.3 C) (05/21 0623) Pulse Rate:  [78-89] 78 (05/21 0623) Resp:  [17-18] 18 (05/21 0623) BP: (115-125)/(42-49) 122/48 mmHg (05/21 0623) SpO2:  [98 %-99 %] 99 % (05/21 0623) Last BM Date: 09/26/12  Intake/Output from previous day: 05/20 0701 - 05/21 0700 In: 1625 [I.V.:1525; IV Piggyback:100] Out: 905 [Urine:700; Drains:205] Intake/Output this shift:    Abdomen soft, non distended, drain serous  Lab Results:   Recent Labs  09/27/12 0510 09/28/12 0505  WBC 17.7* 13.5*  HGB 11.6* 11.0*  HCT 34.2* 33.6*  PLT 360 420*   BMET  Recent Labs  09/27/12 0510 09/28/12 0505  NA 145 142  K 4.1 3.8  CL 113* 112  CO2 26 21  GLUCOSE 131* 123*  BUN 10 6  CREATININE 0.56 0.39*  CALCIUM 7.0* 6.8*   PT/INR No results found for this basename: LABPROT, INR,  in the last 72 hours ABG No results found for this basename: PHART, PCO2, PO2, HCO3,  in the last 72 hours  Studies/Results: No results found.  Anti-infectives: Anti-infectives   Start     Dose/Rate Route Frequency Ordered Stop   09/23/12 2000  piperacillin-tazobactam (ZOSYN) IVPB 3.375 g     3.375 g 12.5 mL/hr over 240 Minutes Intravenous 3 times per day 09/23/12 1229     09/23/12 1215  piperacillin-tazobactam (ZOSYN) IVPB 3.375 g     3.375 g 100 mL/hr over 30 Minutes Intravenous  Once 09/23/12 1209 09/23/12 1308      Assessment/Plan: s/p Procedure(s): APPENDECTOMY LAPAROSCOPIC (N/A)  Awaiting the c diff results Replace calcium Continue IV antibiotics.  WBC down Start clear liquids  LOS: 5 days    Haneef Hallquist A 09/28/2012

## 2012-09-28 NOTE — Progress Notes (Signed)
Physical Therapy Treatment Patient Details Name: Shelia Cunningham MRN: [id number] DOB: 03-Jul-1940 Today's Date: 09/28/2012 Time: 0865-7846 PT Time Calculation (min): 23 min  PT Assessment / Plan / Recommendation Comments on Treatment Session  Pt ready to progress well as of today.  Starting to feel better.  Have asked pt to ask for and do 3-5 walks in halls daily    Follow Up Recommendations  Other (comment);No PT follow up     Does the patient have the potential to tolerate intense rehabilitation     Barriers to Discharge        Equipment Recommendations  None recommended by PT    Recommendations for Other Services    Frequency Min 3X/week   Plan Discharge plan remains appropriate;Frequency remains appropriate    Precautions / Restrictions Precautions Precautions: Fall (minimal risk now) Restrictions Weight Bearing Restrictions: No   Pertinent Vitals/Pain     Mobility  Bed Mobility Bed Mobility: Not assessed Transfers Transfers: Sit to Stand;Stand to Sit Sit to Stand: 5: Supervision;With upper extremity assist;From bed Stand to Sit: 5: Supervision;To bed Details for Transfer Assistance: safe mobility Ambulation/Gait Ambulation/Gait Assistance: 5: Supervision Ambulation Distance (Feet): 950 Feet Assistive device: None;Other (Comment) (started with RW but quickly realized pt did not need it.) Ambulation/Gait Assistance Details: generally steady with mild instability with extreme scanning ie over her shoulders, turning behind while still moving. Gait Pattern: Within Functional Limits Gait velocity: slower, but can change speed significantly Stairs: No    Exercises     PT Diagnosis:    PT Problem List:   PT Treatment Interventions:     PT Goals Acute Rehab PT Goals Time For Goal Achievement: 10/02/12 Potential to Achieve Goals: Good Pt will go Sit to Stand: with modified independence PT Goal: Sit to Stand - Progress: Progressing toward goal Pt will go Stand to  Sit: with modified independence PT Goal: Stand to Sit - Progress: Progressing toward goal Pt will Ambulate: >150 feet;with modified independence;with least restrictive assistive device PT Goal: Ambulate - Progress: Progressing toward goal  Visit Information  Last PT Received On: 09/28/12 Assistance Needed: +1    Subjective Data  Subjective: I feel better.Marland KitchenMarland KitchenMarland KitchenI'm ready   Cognition  Cognition Arousal/Alertness: Awake/alert Behavior During Therapy: WFL for tasks assessed/performed Overall Cognitive Status: Within Functional Limits for tasks assessed    Balance     End of Session PT - End of Session Activity Tolerance: Patient tolerated treatment well Patient left: Other (comment) (getting ready to bathe with staff) Nurse Communication: Mobility status   GP     Shelia Cunningham, Eliseo Gum 09/28/2012, 11:04 AM 09/28/2012  Uvalda Bing, PT [phone]  (pager)

## 2012-09-28 NOTE — Progress Notes (Signed)
Family made aware of the change of room

## 2012-09-29 LAB — CBC
HCT: 33.3 % — ABNORMAL LOW (ref 36.0–46.0)
MCV: 88.6 fL (ref 78.0–100.0)
RBC: 3.76 MIL/uL — ABNORMAL LOW (ref 3.87–5.11)
RDW: 14.6 % (ref 11.5–15.5)
WBC: 12.6 10*3/uL — ABNORMAL HIGH (ref 4.0–10.5)

## 2012-09-29 LAB — BASIC METABOLIC PANEL
BUN: <3 mg/dL — ABNORMAL LOW (ref 6–23)
CO2: 20 mEq/L (ref 19–32)
Chloride: 105 mEq/L (ref 96–112)
Creatinine, Ser: 0.47 mg/dL — ABNORMAL LOW (ref 0.50–1.10)
GFR calc Af Amer: >90 mL/min (ref >90)
Potassium: 3.9 mEq/L (ref 3.5–5.1)

## 2012-09-29 MED ORDER — OXYCODONE HCL 5 MG PO TABS
5.0000 mg | ORAL_TABLET | ORAL | Status: DC | PRN
  Administered 2012-09-30: 10 mg via ORAL
  Administered 2012-09-30 (×2): 5 mg via ORAL
  Administered 2012-10-01: 10 mg via ORAL
  Filled 2012-09-29 (×3): qty 2
  Filled 2012-09-29: qty 1

## 2012-09-29 NOTE — Progress Notes (Signed)
PCP Courtesy Visit: Patient is progressing well.  Tolerating liquid diet.  Abdominal pain controlled.  Persistent diarrhea- C.diff PCR negative.  Ambulating well.  Encouragement provided.  Appreciate General Surgery management.  Will follow peripherally and am available if medical issues arise.  Will discontinue Symbicort as she has not been taking this at home (refusing here).  Continue albuterol and IS.

## 2012-09-29 NOTE — Progress Notes (Signed)
Occupational Therapy Treatment and Discharge Patient Details Name: Shelia Cunningham MRN: [id number] DOB: October 24, 1940 Today's Date: 09/29/2012 Time: 4098-1191 OT Time Calculation (min): 16 min  OT Assessment / Plan / Recommendation Comments on Treatment Session This 72 yo female as exceeded her goals. No further OT needs identified, will sign off.    Follow Up Recommendations  No OT follow up;Supervision - Intermittent       Equipment Recommendations  None recommended by OT       Frequency Min 2X/week   Plan Discharge plan remains appropriate    Precautions / Restrictions Precautions Precautions: None Restrictions Weight Bearing Restrictions: No       ADL  Equipment Used:  (None) ADL Comments: Pt is at an Independent level with all BADLs      OT Goals ADL Goals ADL Goal: Grooming - Progress: Met ADL Goal: Upper Body Bathing - Progress: Met ADL Goal: Lower Body Bathing - Progress: Met ADL Goal: Upper Body Dressing - Progress: Met ADL Goal: Lower Body Dressing - Progress: Met ADL Goal: Toilet Transfer - Progress: Met ADL Goal: Toileting - Clothing Manipulation - Progress: Met ADL Goal: Toileting - Hygiene - Progress: Met ADL Goal: Tub/Shower Transfer - Progress:  (Not addressed, but do not see this as an issue)  Visit Information  Last OT Received On: 09/29/12 Assistance Needed: +1    Subjective Data  Subjective: I am up to the [facility] every hour on the hour with bowel issues      Cognition  Cognition Arousal/Alertness: Awake/alert Behavior During Therapy: WFL for tasks assessed/performed Overall Cognitive Status: Within Functional Limits for tasks assessed    Mobility  Bed Mobility Details for Bed Mobility Assistance: Independent Transfers Details for Transfer Assistance: Independent          End of Session OT - End of Session Activity Tolerance: Patient tolerated treatment well Patient left: in bed;with call bell/phone within reach       Evette Georges [phone] 09/29/2012, 4:44 PM

## 2012-09-29 NOTE — Progress Notes (Signed)
6 Days Post-Op  Subjective: One BM today, feeling better  Objective: Vital signs in last 24 hours: Temp:  [98.1 F (36.7 C)-99 F (37.2 C)] 98.2 F (36.8 C) (05/22 0535) Pulse Rate:  [64-84] 64 (05/22 0535) Resp:  [18-20] 18 (05/22 0535) BP: (116-124)/(29-72) 116/50 mmHg (05/22 0535) SpO2:  [97 %-100 %] 97 % (05/22 0535) Last BM Date: 09/29/12  Intake/Output from previous day: 05/21 0701 - 05/22 0700 In: 1941.7 [P.O.:240; I.V.:1601.7; IV Piggyback:100] Out: 125 [Drains:125] Intake/Output this shift:    General appearance: alert and cooperative Resp: clear to auscultation bilaterally Cardio: regular rate and rhythm GI: soft, less distended, +BS, JP serous Neurologic: Mental status: Alert, oriented, thought content appropriate  Lab Results:   Recent Labs  09/28/12 0505 09/29/12 0500  WBC 13.5* 12.6*  HGB 11.0* 11.0*  HCT 33.6* 33.3*  PLT 420* 439*   BMET  Recent Labs  09/28/12 0505 09/29/12 0500  NA 142 136  K 3.8 3.9  CL 112 105  CO2 21 20  GLUCOSE 123* 115*  BUN 6 <3*  CREATININE 0.39* 0.47*  CALCIUM 6.8* 7.1*   PT/INR No results found for this basename: LABPROT, INR,  in the last 72 hours ABG No results found for this basename: PHART, PCO2, PO2, HCO3,  in the last 72 hours  Studies/Results: No results found.  Anti-infectives: Anti-infectives   Start     Dose/Rate Route Frequency Ordered Stop   09/23/12 2000  piperacillin-tazobactam (ZOSYN) IVPB 3.375 g     3.375 g 12.5 mL/hr over 240 Minutes Intravenous 3 times per day 09/23/12 1229     09/23/12 1215  piperacillin-tazobactam (ZOSYN) IVPB 3.375 g     3.375 g 100 mL/hr over 30 Minutes Intravenous  Once 09/23/12 1209 09/23/12 1308      Assessment/Plan: s/p Procedure(s): APPENDECTOMY LAPAROSCOPIC (N/A) Advance to fulls ID - zosyn, WBC improved Ambulate VTE - SQ hep  LOS: 6 days    Shelia Cunningham E 09/29/2012

## 2012-09-30 MED ORDER — AMOXICILLIN-POT CLAVULANATE 875-125 MG PO TABS
1.0000 | ORAL_TABLET | Freq: Two times a day (BID) | ORAL | Status: DC
  Administered 2012-09-30 – 2012-10-01 (×3): 1 via ORAL
  Filled 2012-09-30 (×5): qty 1

## 2012-09-30 NOTE — Progress Notes (Signed)
Agree with above 

## 2012-09-30 NOTE — Progress Notes (Signed)
Physical Therapy Treatment Patient Details Name: Shelia Cunningham MRN: [id number] DOB: 1940/10/17 Today's Date: 09/30/2012 Time: 4098-1191 PT Time Calculation (min): 17 min  PT Assessment / Plan / Recommendation Comments on Treatment Session  Pt stated she felt much better now that she is unattatched (IV).  Pt OOB in bathroom and amb self around room.  Amb full unit w/o AD.  Progressing well. Will discuss progress with LPT as pt has appeared to have max mobility for this setting.    Follow Up Recommendations        Does the patient have the potential to tolerate intense rehabilitation     Barriers to Discharge        Equipment Recommendations       Recommendations for Other Services    Frequency Min 3X/week   Plan Discharge plan remains appropriate;Frequency remains appropriate    Precautions / Restrictions Precautions Precautions: None   Pertinent Vitals/Pain No c/o pain    Mobility  Bed Mobility Bed Mobility: Not assessed Details for Bed Mobility Assistance: Pt OOB in bathroom Transfers Transfers: Stand to Sit Stand to Sit: 6: Modified independent (Device/Increase time) Details for Transfer Assistance: good use of hands and safety cognition Ambulation/Gait Ambulation/Gait Assistance: 6: Modified independent (Device/Increase time) Ambulation Distance (Feet): 850 Feet Assistive device: None Ambulation/Gait Assistance Details: good alternating gait and good safety cognition Gait Pattern: Within Functional Limits     PT Goals                                             Progressing well    Visit Information  Last PT Received On: 09/30/12 Assistance Needed: +1    Subjective Data  Subjective: I feel great now that I am unattatched (IV) Patient Stated Goal: home   Cognition    good   Balance   good  End of Session PT - End of Session Equipment Utilized During Treatment: Gait belt Activity Tolerance: Patient tolerated treatment well   Felecia Shelling  PTA [facility]   Acute  Rehab Pager      [phone]

## 2012-09-30 NOTE — Progress Notes (Signed)
Patient ID: Shelia Cunningham, female   DOB: 04-Jan-1941, 72 y.o.   MRN: [id number] 7 Days Post-Op  Subjective: Pt feels well.  Diarrhea has stopped.  Tolerating full liquids well.  Objective: Vital signs in last 24 hours: Temp:  [98 F (36.7 C)-99 F (37.2 C)] 98 F (36.7 C) (05/23 0541) Pulse Rate:  [78-86] 78 (05/23 0541) Resp:  [20] 20 (05/23 0541) BP: (115-128)/(39-49) 115/39 mmHg (05/23 0541) SpO2:  [97 %-99 %] 97 % (05/23 0541) Last BM Date: 09/29/12  Intake/Output from previous day: 05/22 0701 - 05/23 0700 In: 2219.2 [P.O.:600; I.V.:1469.2; IV Piggyback:150] Out: 120 [Drains:120] Intake/Output this shift:    PE: Abd: soft, JP with serous fluid, +Bs, incisions c/d/i, minimal bloating  Lab Results:   Recent Labs  09/28/12 0505 09/29/12 0500  WBC 13.5* 12.6*  HGB 11.0* 11.0*  HCT 33.6* 33.3*  PLT 420* 439*   BMET  Recent Labs  09/28/12 0505 09/29/12 0500  NA 142 136  K 3.8 3.9  CL 112 105  CO2 21 20  GLUCOSE 123* 115*  BUN 6 <3*  CREATININE 0.39* 0.47*  CALCIUM 6.8* 7.1*   PT/INR No results found for this basename: LABPROT, INR,  in the last 72 hours CMP     Component Value Date/Time   NA 136 09/29/2012 0500   K 3.9 09/29/2012 0500   CL 105 09/29/2012 0500   CO2 20 09/29/2012 0500   GLUCOSE 115* 09/29/2012 0500   BUN <3* 09/29/2012 0500   CREATININE 0.47* 09/29/2012 0500   CALCIUM 7.1* 09/29/2012 0500   PROT 8.0 09/23/2012 1124   ALBUMIN 3.3* 09/23/2012 1124   AST 19 09/23/2012 1124   ALT 24 09/23/2012 1124   ALKPHOS 95 09/23/2012 1124   BILITOT 0.4 09/23/2012 1124   GFRNONAA >90 09/29/2012 0500   GFRAA >90 09/29/2012 0500   Lipase  No results found for this basename: lipase       Studies/Results: No results found.  Anti-infectives: Anti-infectives   Start     Dose/Rate Route Frequency Ordered Stop   09/30/12 1000  amoxicillin-clavulanate (AUGMENTIN) 875-125 MG per tablet 1 tablet     1 tablet Oral Every 12 hours 09/30/12 0955     09/23/12  2000  piperacillin-tazobactam (ZOSYN) IVPB 3.375 g  Status:  Discontinued     3.375 g 12.5 mL/hr over 240 Minutes Intravenous 3 times per day 09/23/12 1229 09/30/12 0955   09/23/12 1215  piperacillin-tazobactam (ZOSYN) IVPB 3.375 g     3.375 g 100 mL/hr over 30 Minutes Intravenous  Once 09/23/12 1209 09/23/12 1308       Assessment/Plan  1. S/p lap appy for perforated appendix  Plan: 1. Will remove JP tomorrow before dc 2. SLIV 3. Dc zosyn, change to po augmentin 4. Advance to regular diet 5. Home in am   LOS: 7 days    Kenlie Seki E 09/30/2012, 9:57 AM Pager: [phone]

## 2012-10-01 MED ORDER — AMOXICILLIN-POT CLAVULANATE 875-125 MG PO TABS: 1.0000 | ORAL_TABLET | Freq: Two times a day (BID) | ORAL | Status: DC

## 2012-10-01 MED ORDER — HYDROCODONE-ACETAMINOPHEN 5-325 MG PO TABS: 1.0000 | ORAL_TABLET | ORAL | Status: DC | PRN

## 2012-10-01 MED ORDER — HYDROCODONE-ACETAMINOPHEN 5-325 MG PO TABS: 1.0000 | ORAL_TABLET | Freq: Four times a day (QID) | ORAL | Status: DC | PRN

## 2012-10-01 NOTE — Discharge Summary (Signed)
Physician Discharge Summary  Patient ID: Shelia Cunningham MRN: [id number] DOB/AGE: 01-14-1941 72 y.o.  Admit date: 09/23/2012 Discharge date: 10/01/2012  Admission Diagnoses: acute apendicitis Patient Active Problem List   Diagnosis Date Noted  . Acute appendicitis with perforation and peritoneal abscess 09/23/2012  . Pneumonia 02/23/2012  . Cough 02/11/2012    Discharge Diagnoses: same Active Problems:   Acute appendicitis with perforation and peritoneal abscess   Discharged Condition: good  Hospital Course: Pt did  Well.  JP removed on day 8.  Afebrile and tolerating diet with good pain control.  complted 9 days of antibiotics and will treat for total of 14.  Consults: None  Significant Diagnostic Studies: labs:  CBC    Component Value Date/Time   WBC 12.6* 09/29/2012 0500   RBC 3.76* 09/29/2012 0500   HGB 11.0* 09/29/2012 0500   HCT 33.3* 09/29/2012 0500   PLT 439* 09/29/2012 0500   MCV 88.6 09/29/2012 0500   MCH 29.3 09/29/2012 0500   MCHC 33.0 09/29/2012 0500   RDW 14.6 09/29/2012 0500   LYMPHSABS 1.1 09/23/2012 1124   MONOABS 0.8 09/23/2012 1124   EOSABS 0.0 09/23/2012 1124   BASOSABS 0.0 09/23/2012 1124     Treatments: surgery: laparoscopic appendectomy  Discharge Exam: Blood pressure 108/36, pulse 78, temperature 98.5 F (36.9 C), temperature source Oral, resp. rate 19, height 5\' 6"  (1.676 m), weight 135 lb (61.236 kg), SpO2 98.00%. Incision/Wound:softt  Non tender   WOUNDS CLEAN DRY INTACT.   Disposition: Final discharge disposition not confirmed  Discharge Orders   Future Orders Complete By Expires     Diet - low sodium heart healthy  As directed     Increase activity slowly  As directed         Medication List    TAKE these medications       albuterol 108 (90 BASE) MCG/ACT inhaler  Commonly known as:  PROVENTIL HFA;VENTOLIN HFA  Inhale 2 puffs into the lungs every 6 (six) hours as needed for wheezing.     amLODipine 5 MG tablet  Commonly known as:   NORVASC  Take 5 mg by mouth daily.     amoxicillin-clavulanate 875-125 MG per tablet  Commonly known as:  AUGMENTIN  Take 1 tablet by mouth every 12 (twelve) hours.     budesonide-formoterol 80-4.5 MCG/ACT inhaler  Commonly known as:  SYMBICORT  Inhale 2 puffs into the lungs 2 (two) times daily.     GNP CALCIUM PLUS 600 +D PO  Take 1 tablet by mouth 2 (two) times daily.     HYDROcodone-acetaminophen 5-325 MG per tablet  Commonly known as:  NORCO/VICODIN  Take 1 tablet by mouth every 4 (four) hours as needed.     multivitamin capsule  Take 1 capsule by mouth daily.     SUMAtriptan 100 MG tablet  Commonly known as:  IMITREX  Take 100 mg by mouth daily as needed for migraine.     Vitamin D-3 1000 UNITS Caps  Take 1 capsule by mouth daily.           Follow-up Information   Follow up with [facility]. Schedule an appointment as soon as possible for a visit in 2 weeks.   Contact information:   [address]   [place] [address] [phone]       Signed: Dortha Schwalbe. 10/01/2012, 10:32 AM

## 2012-10-01 NOTE — Progress Notes (Signed)
8 Days Post-Op  Subjective: Doing well drain removed.  Objective: Vital signs in last 24 hours: Temp:  [98.4 F (36.9 C)-99 F (37.2 C)] 98.5 F (36.9 C) (05/24 0600) Pulse Rate:  [74-84] 78 (05/24 0600) Resp:  [18-19] 19 (05/24 0600) BP: (108-125)/(36-45) 108/36 mmHg (05/24 1000) SpO2:  [97 %-99 %] 98 % (05/24 0600) Last BM Date: 09/30/12  Intake/Output from previous day: 05/23 0701 - 05/24 0700 In: 360 [P.O.:360] Out: 80 [Drains:80] Intake/Output this shift:    Incision/Wound:soft non tender.  Drain serous and removed.  Lab Results:   Recent Labs  09/29/12 0500  WBC 12.6*  HGB 11.0*  HCT 33.3*  PLT 439*   BMET  Recent Labs  09/29/12 0500  NA 136  K 3.9  CL 105  CO2 20  GLUCOSE 115*  BUN <3*  CREATININE 0.47*  CALCIUM 7.1*   PT/INR No results found for this basename: LABPROT, INR,  in the last 72 hours ABG No results found for this basename: PHART, PCO2, PO2, HCO3,  in the last 72 hours  Studies/Results: No results found.  Anti-infectives: Anti-infectives   Start     Dose/Rate Route Frequency Ordered Stop   09/30/12 1100  amoxicillin-clavulanate (AUGMENTIN) 875-125 MG per tablet 1 tablet     1 tablet Oral Every 12 hours 09/30/12 0955     09/23/12 2000  piperacillin-tazobactam (ZOSYN) IVPB 3.375 g  Status:  Discontinued     3.375 g 12.5 mL/hr over 240 Minutes Intravenous 3 times per day 09/23/12 1229 09/30/12 0955   09/23/12 1215  piperacillin-tazobactam (ZOSYN) IVPB 3.375 g     3.375 g 100 mL/hr over 30 Minutes Intravenous  Once 09/23/12 1209 09/23/12 1308      Assessment/Plan: s/p Procedure(s): APPENDECTOMY LAPAROSCOPIC (N/A) Discharge  LOS: 8 days    Shelia Ates A. 10/01/2012

## 2012-10-26 ENCOUNTER — Encounter (INDEPENDENT_AMBULATORY_CARE_PROVIDER_SITE_OTHER): Payer: Self-pay | Admitting: General Surgery

## 2012-10-26 ENCOUNTER — Ambulatory Visit (INDEPENDENT_AMBULATORY_CARE_PROVIDER_SITE_OTHER): Payer: Medicare Other | Admitting: General Surgery

## 2012-10-26 VITALS — BP 118/64 | HR 79 | Temp 99.6°F | Resp 16 | Ht 66.0 in | Wt 130.8 lb

## 2012-10-26 DIAGNOSIS — Z9049 Acquired absence of other specified parts of digestive tract: Secondary | ICD-10-CM | POA: Insufficient documentation

## 2012-10-26 DIAGNOSIS — Z9889 Other specified postprocedural states: Secondary | ICD-10-CM

## 2012-10-26 NOTE — Progress Notes (Signed)
Subjective:     Patient ID: Shelia Cunningham, female   DOB: 1940-08-04, 72 y.o.   MRN: [id number]  HPI Patient presents status post laparoscopic appendectomy for perforated appendicitis. She is improved significantly. She is having some difficulties with sleeping but is taking over-the-counter Benadryl which has helped. She's having no abdominal pain. She is eating and moving her bowels well.  Review of Systems     Objective:   Physical Exam Abdomen is soft and nontender. All 3 incisions are well-healed without signs of infection.     Assessment:     Doing well status post laparoscopic appendectomy    Plan:     I would have a lifting for a total of 6 weeks after surgery, return as needed.

## 2012-10-30 DIAGNOSIS — R634 Abnormal weight loss: Secondary | ICD-10-CM | POA: Diagnosis not present

## 2012-10-30 DIAGNOSIS — IMO0002 Reserved for concepts with insufficient information to code with codable children: Secondary | ICD-10-CM | POA: Diagnosis not present

## 2012-10-30 DIAGNOSIS — K3533 Acute appendicitis with perforation and localized peritonitis, with abscess: Secondary | ICD-10-CM | POA: Diagnosis not present

## 2012-11-30 DIAGNOSIS — M810 Age-related osteoporosis without current pathological fracture: Secondary | ICD-10-CM | POA: Diagnosis not present

## 2013-02-07 DIAGNOSIS — Z23 Encounter for immunization: Secondary | ICD-10-CM | POA: Diagnosis not present

## 2013-05-02 DIAGNOSIS — Z0000 Encounter for general adult medical examination without abnormal findings: Secondary | ICD-10-CM | POA: Diagnosis not present

## 2013-05-02 DIAGNOSIS — M899 Disorder of bone, unspecified: Secondary | ICD-10-CM | POA: Diagnosis not present

## 2013-05-02 DIAGNOSIS — G43909 Migraine, unspecified, not intractable, without status migrainosus: Secondary | ICD-10-CM | POA: Diagnosis not present

## 2013-05-02 DIAGNOSIS — G4700 Insomnia, unspecified: Secondary | ICD-10-CM | POA: Diagnosis not present

## 2013-05-09 DIAGNOSIS — Z1331 Encounter for screening for depression: Secondary | ICD-10-CM | POA: Diagnosis not present

## 2013-05-09 DIAGNOSIS — G43909 Migraine, unspecified, not intractable, without status migrainosus: Secondary | ICD-10-CM | POA: Diagnosis not present

## 2013-05-09 DIAGNOSIS — G4700 Insomnia, unspecified: Secondary | ICD-10-CM | POA: Diagnosis not present

## 2013-05-09 DIAGNOSIS — Z0000 Encounter for general adult medical examination without abnormal findings: Secondary | ICD-10-CM | POA: Diagnosis not present

## 2013-05-09 DIAGNOSIS — M899 Disorder of bone, unspecified: Secondary | ICD-10-CM | POA: Diagnosis not present

## 2013-05-09 DIAGNOSIS — Z6827 Body mass index (BMI) 27.0-27.9, adult: Secondary | ICD-10-CM | POA: Diagnosis not present

## 2013-05-09 DIAGNOSIS — R05 Cough: Secondary | ICD-10-CM | POA: Diagnosis not present

## 2013-05-09 DIAGNOSIS — R490 Dysphonia: Secondary | ICD-10-CM | POA: Diagnosis not present

## 2013-05-12 DIAGNOSIS — Z1212 Encounter for screening for malignant neoplasm of rectum: Secondary | ICD-10-CM | POA: Diagnosis not present

## 2013-05-19 DIAGNOSIS — H2510 Age-related nuclear cataract, unspecified eye: Secondary | ICD-10-CM | POA: Diagnosis not present

## 2013-06-05 DIAGNOSIS — J029 Acute pharyngitis, unspecified: Secondary | ICD-10-CM | POA: Diagnosis not present

## 2013-06-05 DIAGNOSIS — R509 Fever, unspecified: Secondary | ICD-10-CM | POA: Diagnosis not present

## 2013-06-05 DIAGNOSIS — J1289 Other viral pneumonia: Secondary | ICD-10-CM | POA: Diagnosis not present

## 2013-06-05 DIAGNOSIS — IMO0002 Reserved for concepts with insufficient information to code with codable children: Secondary | ICD-10-CM | POA: Diagnosis not present

## 2013-06-07 DIAGNOSIS — M810 Age-related osteoporosis without current pathological fracture: Secondary | ICD-10-CM | POA: Diagnosis not present

## 2013-07-11 DIAGNOSIS — Z23 Encounter for immunization: Secondary | ICD-10-CM | POA: Diagnosis not present

## 2013-07-11 DIAGNOSIS — J1289 Other viral pneumonia: Secondary | ICD-10-CM | POA: Diagnosis not present

## 2013-07-27 ENCOUNTER — Other Ambulatory Visit: Payer: Self-pay

## 2013-07-27 DIAGNOSIS — Z1231 Encounter for screening mammogram for malignant neoplasm of breast: Secondary | ICD-10-CM

## 2013-08-17 ENCOUNTER — Ambulatory Visit
Admission: RE | Admit: 2013-08-17 | Discharge: 2013-08-17 | Disposition: A | Discharge status: Home / Self Care | Payer: Medicare Other | Source: Ambulatory Visit

## 2013-08-17 DIAGNOSIS — Z1231 Encounter for screening mammogram for malignant neoplasm of breast: Secondary | ICD-10-CM | POA: Diagnosis not present

## 2013-12-06 DIAGNOSIS — IMO0002 Reserved for concepts with insufficient information to code with codable children: Secondary | ICD-10-CM | POA: Diagnosis not present

## 2013-12-06 DIAGNOSIS — M810 Age-related osteoporosis without current pathological fracture: Secondary | ICD-10-CM | POA: Diagnosis not present

## 2014-02-22 DIAGNOSIS — Z23 Encounter for immunization: Secondary | ICD-10-CM | POA: Diagnosis not present

## 2014-04-16 DIAGNOSIS — Z6823 Body mass index (BMI) 23.0-23.9, adult: Secondary | ICD-10-CM | POA: Diagnosis not present

## 2014-04-16 DIAGNOSIS — S39012A Strain of muscle, fascia and tendon of lower back, initial encounter: Secondary | ICD-10-CM | POA: Diagnosis not present

## 2014-05-14 DIAGNOSIS — S39012D Strain of muscle, fascia and tendon of lower back, subsequent encounter: Secondary | ICD-10-CM | POA: Diagnosis not present

## 2014-05-14 DIAGNOSIS — Z6823 Body mass index (BMI) 23.0-23.9, adult: Secondary | ICD-10-CM | POA: Diagnosis not present

## 2014-05-16 DIAGNOSIS — M9904 Segmental and somatic dysfunction of sacral region: Secondary | ICD-10-CM | POA: Diagnosis not present

## 2014-05-16 DIAGNOSIS — M9905 Segmental and somatic dysfunction of pelvic region: Secondary | ICD-10-CM | POA: Diagnosis not present

## 2014-05-16 DIAGNOSIS — M5417 Radiculopathy, lumbosacral region: Secondary | ICD-10-CM | POA: Diagnosis not present

## 2014-05-16 DIAGNOSIS — M9903 Segmental and somatic dysfunction of lumbar region: Secondary | ICD-10-CM | POA: Diagnosis not present

## 2014-05-16 DIAGNOSIS — M5127 Other intervertebral disc displacement, lumbosacral region: Secondary | ICD-10-CM | POA: Diagnosis not present

## 2014-06-19 DIAGNOSIS — Z79899 Other long term (current) drug therapy: Secondary | ICD-10-CM | POA: Diagnosis not present

## 2014-06-19 DIAGNOSIS — M8580 Other specified disorders of bone density and structure, unspecified site: Secondary | ICD-10-CM | POA: Diagnosis not present

## 2014-06-19 DIAGNOSIS — J189 Pneumonia, unspecified organism: Secondary | ICD-10-CM | POA: Diagnosis not present

## 2014-06-19 DIAGNOSIS — R05 Cough: Secondary | ICD-10-CM | POA: Diagnosis not present

## 2014-06-19 DIAGNOSIS — M810 Age-related osteoporosis without current pathological fracture: Secondary | ICD-10-CM | POA: Diagnosis not present

## 2014-07-03 DIAGNOSIS — R42 Dizziness and giddiness: Secondary | ICD-10-CM | POA: Diagnosis not present

## 2014-07-03 DIAGNOSIS — Z6823 Body mass index (BMI) 23.0-23.9, adult: Secondary | ICD-10-CM | POA: Diagnosis not present

## 2014-07-03 DIAGNOSIS — G43909 Migraine, unspecified, not intractable, without status migrainosus: Secondary | ICD-10-CM | POA: Diagnosis not present

## 2014-07-12 DIAGNOSIS — H2513 Age-related nuclear cataract, bilateral: Secondary | ICD-10-CM | POA: Diagnosis not present

## 2014-07-16 ENCOUNTER — Other Ambulatory Visit (HOSPITAL_COMMUNITY): Payer: Self-pay | Admitting: *Deleted

## 2014-07-16 DIAGNOSIS — I788 Other diseases of capillaries: Secondary | ICD-10-CM | POA: Diagnosis not present

## 2014-07-16 DIAGNOSIS — D1801 Hemangioma of skin and subcutaneous tissue: Secondary | ICD-10-CM | POA: Diagnosis not present

## 2014-07-16 DIAGNOSIS — L570 Actinic keratosis: Secondary | ICD-10-CM | POA: Diagnosis not present

## 2014-07-16 DIAGNOSIS — L821 Other seborrheic keratosis: Secondary | ICD-10-CM | POA: Diagnosis not present

## 2014-07-16 DIAGNOSIS — D225 Melanocytic nevi of trunk: Secondary | ICD-10-CM | POA: Diagnosis not present

## 2014-07-16 DIAGNOSIS — D2272 Melanocytic nevi of left lower limb, including hip: Secondary | ICD-10-CM | POA: Diagnosis not present

## 2014-07-17 ENCOUNTER — Ambulatory Visit (HOSPITAL_COMMUNITY)
Admission: RE | Admit: 2014-07-17 | Discharge: 2014-07-17 | Disposition: A | Discharge status: Home / Self Care | Payer: Medicare Other | Source: Ambulatory Visit | Attending: Internal Medicine | Admitting: Internal Medicine

## 2014-07-17 DIAGNOSIS — M810 Age-related osteoporosis without current pathological fracture: Secondary | ICD-10-CM | POA: Insufficient documentation

## 2014-07-17 MED ORDER — DENOSUMAB 60 MG/ML ~~LOC~~ SOLN
60.0000 mg | Freq: Once | SUBCUTANEOUS | Status: AC
  Administered 2014-07-17: 60 mg via SUBCUTANEOUS
  Filled 2014-07-17: qty 1

## 2014-07-19 ENCOUNTER — Other Ambulatory Visit: Payer: Self-pay

## 2014-07-19 DIAGNOSIS — Z1231 Encounter for screening mammogram for malignant neoplasm of breast: Secondary | ICD-10-CM

## 2014-08-21 DIAGNOSIS — G43909 Migraine, unspecified, not intractable, without status migrainosus: Secondary | ICD-10-CM | POA: Diagnosis not present

## 2014-08-21 DIAGNOSIS — R8299 Other abnormal findings in urine: Secondary | ICD-10-CM | POA: Diagnosis not present

## 2014-08-21 DIAGNOSIS — N390 Urinary tract infection, site not specified: Secondary | ICD-10-CM | POA: Diagnosis not present

## 2014-08-21 DIAGNOSIS — M859 Disorder of bone density and structure, unspecified: Secondary | ICD-10-CM | POA: Diagnosis not present

## 2014-08-21 DIAGNOSIS — Z0000 Encounter for general adult medical examination without abnormal findings: Secondary | ICD-10-CM | POA: Diagnosis not present

## 2014-08-28 ENCOUNTER — Ambulatory Visit
Admission: RE | Admit: 2014-08-28 | Discharge: 2014-08-28 | Disposition: A | Discharge status: Home / Self Care | Payer: Medicare Other | Source: Ambulatory Visit

## 2014-08-28 DIAGNOSIS — Z1231 Encounter for screening mammogram for malignant neoplasm of breast: Secondary | ICD-10-CM

## 2014-08-29 DIAGNOSIS — R05 Cough: Secondary | ICD-10-CM | POA: Diagnosis not present

## 2014-08-29 DIAGNOSIS — R490 Dysphonia: Secondary | ICD-10-CM | POA: Diagnosis not present

## 2014-08-29 DIAGNOSIS — Z6823 Body mass index (BMI) 23.0-23.9, adult: Secondary | ICD-10-CM | POA: Diagnosis not present

## 2014-08-29 DIAGNOSIS — M8580 Other specified disorders of bone density and structure, unspecified site: Secondary | ICD-10-CM | POA: Diagnosis not present

## 2014-08-29 DIAGNOSIS — M545 Low back pain: Secondary | ICD-10-CM | POA: Diagnosis not present

## 2014-08-29 DIAGNOSIS — Z0000 Encounter for general adult medical examination without abnormal findings: Secondary | ICD-10-CM | POA: Diagnosis not present

## 2014-08-29 DIAGNOSIS — Z1389 Encounter for screening for other disorder: Secondary | ICD-10-CM | POA: Diagnosis not present

## 2014-08-29 DIAGNOSIS — G4700 Insomnia, unspecified: Secondary | ICD-10-CM | POA: Diagnosis not present

## 2014-08-29 DIAGNOSIS — I781 Nevus, non-neoplastic: Secondary | ICD-10-CM | POA: Diagnosis not present

## 2014-08-29 DIAGNOSIS — G43909 Migraine, unspecified, not intractable, without status migrainosus: Secondary | ICD-10-CM | POA: Diagnosis not present

## 2014-08-31 DIAGNOSIS — Z1212 Encounter for screening for malignant neoplasm of rectum: Secondary | ICD-10-CM | POA: Diagnosis not present

## 2014-09-04 DIAGNOSIS — M859 Disorder of bone density and structure, unspecified: Secondary | ICD-10-CM | POA: Diagnosis not present

## 2014-09-05 DIAGNOSIS — M79604 Pain in right leg: Secondary | ICD-10-CM | POA: Diagnosis not present

## 2014-09-12 DIAGNOSIS — I8311 Varicose veins of right lower extremity with inflammation: Secondary | ICD-10-CM | POA: Diagnosis not present

## 2014-09-21 DIAGNOSIS — I8312 Varicose veins of left lower extremity with inflammation: Secondary | ICD-10-CM | POA: Diagnosis not present

## 2014-09-21 DIAGNOSIS — I8311 Varicose veins of right lower extremity with inflammation: Secondary | ICD-10-CM | POA: Diagnosis not present

## 2015-03-12 DIAGNOSIS — Z23 Encounter for immunization: Secondary | ICD-10-CM | POA: Diagnosis not present

## 2015-04-29 DIAGNOSIS — R05 Cough: Secondary | ICD-10-CM | POA: Diagnosis not present

## 2015-04-29 DIAGNOSIS — G43909 Migraine, unspecified, not intractable, without status migrainosus: Secondary | ICD-10-CM | POA: Diagnosis not present

## 2015-04-29 DIAGNOSIS — Z6822 Body mass index (BMI) 22.0-22.9, adult: Secondary | ICD-10-CM | POA: Diagnosis not present

## 2015-07-26 ENCOUNTER — Other Ambulatory Visit (INDEPENDENT_AMBULATORY_CARE_PROVIDER_SITE_OTHER): Payer: Medicare Other

## 2015-07-26 ENCOUNTER — Ambulatory Visit (INDEPENDENT_AMBULATORY_CARE_PROVIDER_SITE_OTHER): Payer: Medicare Other | Admitting: Internal Medicine

## 2015-07-26 ENCOUNTER — Encounter: Payer: Self-pay | Admitting: Internal Medicine

## 2015-07-26 VITALS — BP 110/68 | HR 70 | Ht 66.0 in | Wt 139.6 lb

## 2015-07-26 DIAGNOSIS — R059 Cough, unspecified: Secondary | ICD-10-CM

## 2015-07-26 DIAGNOSIS — R05 Cough: Secondary | ICD-10-CM

## 2015-07-26 LAB — CBC WITH DIFFERENTIAL/PLATELET
BASOS ABS: 0 10*3/uL (ref 0.0–0.1)
Basophils Relative: 0.4 % (ref 0.0–3.0)
Eosinophils Absolute: 0.1 10*3/uL (ref 0.0–0.7)
Eosinophils Relative: 1.8 % (ref 0.0–5.0)
HCT: 40.8 % (ref 36.0–46.0)
HEMOGLOBIN: 13.7 g/dL (ref 12.0–15.0)
LYMPHS ABS: 2.1 10*3/uL (ref 0.7–4.0)
Lymphocytes Relative: 27.3 % (ref 12.0–46.0)
MCHC: 33.5 g/dL (ref 30.0–36.0)
MCV: 90.1 fl (ref 78.0–100.0)
MONO ABS: 0.6 10*3/uL (ref 0.1–1.0)
Monocytes Relative: 7.5 % (ref 3.0–12.0)
NEUTROS PCT: 63 % (ref 43.0–77.0)
Neutro Abs: 4.9 10*3/uL (ref 1.4–7.7)
Platelets: 235 10*3/uL (ref 150.0–400.0)
RBC: 4.54 Mil/uL (ref 3.87–5.11)
RDW: 14 % (ref 11.5–15.5)
WBC: 7.8 10*3/uL (ref 4.0–10.5)

## 2015-07-26 MED ORDER — PREDNISONE 10 MG PO TABS: ORAL_TABLET | ORAL | Status: DC

## 2015-07-26 NOTE — Progress Notes (Signed)
Subjective:    Patient ID: Shelia Cunningham, female    DOB: 05-Aug-1940, 75 y.o.   MRN: [id number]  HPI   15 yowf never smoker with no previous resp problem whatsoever until 04/2009 with  daily cough since so referred 02/10/2012 to pulmonary by Dr Delorise Royals and resolved p rx for UACS recurred w/in a year of last eval in 2013:   02/10/2012 1st pulmonary eval cc cough daily best treatment seems to be abx but stopped fosfamax on 01/13/12 and some better since then, in general cough is more dry than wet, throat irritation, worse p stir in am and not present during heavy sleep. No better on symbicort in fact hoarseness worse, cough may be worse as well-   rec Stop symbicort and albuterol Try prilosec 20mg   Take 30-60 min before first meal of the day and Pepcid 20 mg one bedtime until cough is completely gone for at least a week without the need for cough suppression (delsym cough syrup)  GERD diet   Stay off fosfamax for now - it is available in IV form (reclast) once yearly and that might be a better choice for you if Dr Brigitte Pulse feels you need it Stop benadryl and chlortrimeton 4mg  every 4-6 hours  If cough flares return here as soon as possible for step 2 > did not return as requested    07/26/2015 1st  Pulmonary office visit/ [facility]   Chief Complaint  Patient presents with  . Pulmonary Consult    Pt seen here last in 2013 for cough. She was referred back by Dr. Lutricia Feil. She states her cough has never completely resolved. It is some better since her last visit here in 2013. Cough is occ prod with light gray sputum.   cough tends to be every day before or after lunch and worse if lie down p lunch but not at hs (never keeps her up or wakes her up  Had seen ent prior to last pulmonary eval abx may have helped transiently as did prednisone  Prednisone / gerd rx may have helped transiently also  No better with symbicort      Kouffman Reflux v Neurogenic Cough Differentiator Reflux Comments    Do you awaken from a sound sleep coughing violently?                            With trouble breathing? No     Do you have choking episodes when you cannot  Get enough air, gasping for air ?              No     Do you usually cough when you lie down into  The bed, or when you just lie down to rest ?                          Varies, does cough with naps   Do you usually cough after meals or eating?         No   Do you cough when (or after) you bend over?    No   GERD SCORE     Kouffman Reflux v Neurogenic Cough Differentiator Neurogenic   Do you more-or-less cough all day long? sporadic   Does change of temperature make you cough? No   Does laughing or chuckling cause you to cough? Yes   Do fumes (perfume, automobile fumes, burned  Toast,  etc.,) cause you to cough ?      No    Does speaking, singing, or talking on the phone cause you to cough   ?               Yes    Neurogenic/Airway score    Not limited by breathing from desired activities    No obvious other patterns in day to day or daytime variabilty or assoc sob  or cp or chest tightness, subjective wheeze overt sinus or hb symptoms. No unusual exp hx or h/o childhood pna/ asthma or knowledge of premature birth.  Sleeping ok without nocturnal  or early am exacerbation  of respiratory  c/o's or need for noct saba. Also denies any obvious fluctuation of symptoms with weather or environmental changes or other aggravating or alleviating factors except as outlined above   Current Medications, Allergies, Complete Past Medical History, Past Surgical History, Family History, and Social History were reviewed in [employer] record.            Review of Systems  Constitutional: Negative.  Negative for fever, chills and unexpected weight change.  HENT: Negative.  Negative for congestion, dental problem, ear pain, nosebleeds, postnasal drip, rhinorrhea, sinus pressure, sneezing, sore throat, trouble swallowing and  voice change.   Eyes: Negative.  Negative for visual disturbance.  Respiratory: Positive for cough. Negative for apnea, choking, chest tightness, shortness of breath, wheezing and stridor.   Cardiovascular: Negative.  Negative for chest pain, palpitations and leg swelling.  Gastrointestinal: Negative.  Negative for nausea, vomiting, abdominal pain, diarrhea and abdominal distention.  Genitourinary: Negative.  Negative for difficulty urinating.  Musculoskeletal: Negative.  Negative for myalgias and arthralgias.  Skin: Negative.  Negative for rash.  Allergic/Immunologic: Negative.  Negative for environmental allergies and food allergies.  Neurological: Negative.  Negative for dizziness, tremors, syncope, weakness and headaches.  Hematological: Negative.  Negative for adenopathy. Does not bruise/bleed easily.  Psychiatric/Behavioral: Negative.  Negative for sleep disturbance and agitation. The patient is not nervous/anxious.        Objective:   Physical Exam  amb wf slt hoarse wf nad  Wt Readings from Last 3 Encounters:  07/26/15 139 lb 9.6 oz (63.322 kg)  10/26/12 130 lb 12.8 oz (59.33 kg)  09/23/12 135 lb (61.236 kg)    Vital signs reviewed  HEENT: nl dentition, turbinates, and oropharynx. Nl external ear canals without cough reflex   NECK :  without JVD/Nodes/TM/ nl carotid upstrokes bilaterally   LUNGS: no acc muscle use,  Nl contour chest which is clear to A and P bilaterally without cough on insp or exp maneuvers though  pseudowheeze prominent on exp eliminates with neck in midline position and purse lip maneuvers    CV:  RRR  no s3 or murmur or increase in P2, no edema   ABD:  soft and nontender with nl inspiratory excursion in the supine position. No bruits or organomegaly, bowel sounds nl  MS:  Nl gait/ ext warm without deformities, calf tenderness, cyanosis or clubbing No obvious joint restrictions   SKIN: warm and dry without lesions    NEURO:  alert, approp, nl  sensorium with  no motor deficits   cxr 04/29/15 wnl        Assessment & Plan:

## 2015-07-26 NOTE — Patient Instructions (Addendum)
Please see patient coordinator before you leave today  to schedule sinus ct   Please remember to go to the lab   department downstairs for your tests - we will call you with the results when they are available.  Prednisone 10 mg take  4 each am x 2 days,   2 each am x 2 days,  1 each am x 2 days and stop   Try prilosec otc 20mg   Take 30-60 min before first meal of the day and Pepcid ac (famotidine) 20 mg one @  bedtime until cough is completely gone for at least a week without the need for cough suppression  For drainage / throat tickle try take CHLORPHENIRAMINE  4 mg - take one every 4 hours as needed - available over the counter- may cause drowsiness so start with just a bedtime dose or two and see how you tolerate it before trying in daytime    GERD (REFLUX)  is an extremely common cause of respiratory symptoms just like yours , many times with no obvious heartburn at all.    It can be treated with medication, but also with lifestyle changes including elevation of the head of your bed (ideally with 6 inch  bed blocks),  Smoking cessation, avoidance of late meals, excessive alcohol, and avoid fatty foods, chocolate, peppermint, colas, red wine, and acidic juices such as orange juice.  NO MINT OR MENTHOL PRODUCTS SO NO COUGH DROPS  USE SUGARLESS CANDY INSTEAD (Jolley ranchers or Stover's or Life Savers) or even ice chips will also do - the key is to swallow to prevent all throat clearing. NO OIL BASED VITAMINS - use powdered substitutes.  Substitute the calcium citrate for the carbonate     Please schedule a follow up office visit in  2 weeks, sooner if needed

## 2015-07-26 NOTE — Assessment & Plan Note (Addendum)
Spirometry 07/26/2015 wnl   The most common causes of chronic cough in immunocompetent adults include the following: upper airway cough syndrome (UACS), previously referred to as postnasal drip syndrome (PNDS), which is caused by variety of rhinosinus conditions; (2) asthma; (3) GERD; (4) chronic bronchitis from cigarette smoking or other inhaled environmental irritants; (5) nonasthmatic eosinophilic bronchitis; and (6) bronchiectasis.   These conditions, singly or in combination, have accounted for up to 94% of the causes of chronic cough in prospective studies.   Other conditions have constituted no >6% of the causes in prospective studies These have included bronchogenic carcinoma, chronic interstitial pneumonia, sarcoidosis, left ventricular failure, ACEI-induced cough, and aspiration from a condition associated with pharyngeal dysfunction.    Chronic cough is often simultaneously caused by more than one condition. A single cause has been found from 38 to 82% of the time, multiple causes from 18 to 62%. Multiply caused cough has been the result of three diseases up to 42% of the time.       Based on hx and exam, this is most likely:  Classic Upper airway cough syndrome, so named because it's frequently impossible to sort out how much is  CR/sinusitis with freq throat clearing (which can be related to primary GERD)   vs  causing  secondary (" extra esophageal")  GERD from wide swings in gastric pressure that occur with throat clearing, often  promoting self use of mint and menthol lozenges that reduce the lower esophageal sphincter tone and exacerbate the problem further in a cyclical fashion.   These are the same pts (now being labeled as having "irritable larynx syndrome" by some cough centers) who not infrequently have a history of having failed to tolerate ace inhibitors,  dry powder inhalers or biphosphonates or report having atypical reflux symptoms that don't respond to standard doses of PPI ,  and are easily confused as having aecopd or asthma flares by even experienced allergists/ pulmonologists.   The first step is to maximize acid suppression and eliminate cyclical coughing then regroup every 2-4 weeks until we sort out a true response or lack thereof while complete the w/u with sinus CT and allergy profile to start with ? Needs Methacholine challenge next then trial of gabapentin.  I had an extended discussion with the patient reviewing all relevant studies completed to date and  lasting 35/60 min  1) Explained: The standardized cough guidelines published in Chest by Lissa Morales in 2006 are still the best available and consist of a multiple step process (up to 12!) , not a single office visit,  and are intended  to address this problem logically,  with an alogrithm dependent on response to empiric treatment at  each progressive step  to determine a specific diagnosis with  minimal addtional testing needed. Therefore if adherence is an issue or can't be accurately verified,  it's very unlikely the standard evaluation and treatment will be successful here.    Furthermore, response to therapy (other than acute cough suppression, which should only be used short term with avoidance of narcotic containing cough syrups if possible), can be a gradual process for which the patient may perceive immediate benefit.  Unlike going to an eye doctor where the best perscription is almost always the first one and is immediately effective, this is almost never the case in the management of chronic cough syndromes. Therefore the patient needs to commit up front to consistently adhere to recommendations  for up to 6 weeks of therapy directed  at the likely underlying problem(s) before the response can be reasonably evaluated.     2) Each maintenance medication was reviewed in detail including most importantly the difference between maintenance and prns and under what circumstances the prns are to be  triggered using an action plan format that is not reflected in the computer generated alphabetically organized AVS.    Please see instructions for details which were reviewed in writing and the patient given a copy highlighting the part that I personally wrote and discussed at today's ov.   See instructions for specific recommendations which were reviewed directly with the patient who was given a copy with highlighter outlining the key components.

## 2015-07-31 LAB — RESPIRATORY ALLERGY PROFILE REGION II ~~LOC~~
Allergen, D pternoyssinus,d7: <0.1 kU/L
Allergen, Oak,t7: <0.1 kU/L
Alternaria Alternata: <0.1 kU/L
Cat Dander: <0.1 kU/L
Cladosporium Herbarum: <0.1 kU/L
D. farinae: <0.1 kU/L
Elm IgE: <0.1 kU/L
IGE (IMMUNOGLOBULIN E), SERUM: 77 kU/L (ref <115)
Pecan/Hickory Tree IgE: <0.1 kU/L
Rough Pigweed  IgE: <0.1 kU/L
Timothy Grass: <0.1 kU/L

## 2015-08-01 ENCOUNTER — Telehealth: Payer: Self-pay | Admitting: Internal Medicine

## 2015-08-01 NOTE — Progress Notes (Signed)
Quick Note:  LMTCB ______ 

## 2015-08-01 NOTE — Telephone Encounter (Signed)
Per lab result note: Notes Recorded by Tanda Rockers, MD on 07/31/2015 at 3:52 PM Call patient : Studies are unremarkable, no change in recs ---  I spoke with patient about results and she verbalized understanding and had no questions

## 2015-08-02 ENCOUNTER — Ambulatory Visit (INDEPENDENT_AMBULATORY_CARE_PROVIDER_SITE_OTHER)
Admission: RE | Admit: 2015-08-02 | Discharge: 2015-08-02 | Disposition: A | Discharge status: Home / Self Care | Payer: Medicare Other | Source: Ambulatory Visit | Attending: Internal Medicine | Admitting: Internal Medicine

## 2015-08-02 DIAGNOSIS — R05 Cough: Secondary | ICD-10-CM

## 2015-08-02 DIAGNOSIS — H2513 Age-related nuclear cataract, bilateral: Secondary | ICD-10-CM | POA: Diagnosis not present

## 2015-08-02 DIAGNOSIS — R059 Cough, unspecified: Secondary | ICD-10-CM

## 2015-08-05 NOTE — Progress Notes (Signed)
Quick Note:  LMTCB ______ 

## 2015-08-06 DIAGNOSIS — L738 Other specified follicular disorders: Secondary | ICD-10-CM | POA: Diagnosis not present

## 2015-08-06 DIAGNOSIS — L820 Inflamed seborrheic keratosis: Secondary | ICD-10-CM | POA: Diagnosis not present

## 2015-08-06 DIAGNOSIS — L821 Other seborrheic keratosis: Secondary | ICD-10-CM | POA: Diagnosis not present

## 2015-08-06 DIAGNOSIS — D485 Neoplasm of uncertain behavior of skin: Secondary | ICD-10-CM | POA: Diagnosis not present

## 2015-08-06 DIAGNOSIS — D1801 Hemangioma of skin and subcutaneous tissue: Secondary | ICD-10-CM | POA: Diagnosis not present

## 2015-08-06 DIAGNOSIS — L570 Actinic keratosis: Secondary | ICD-10-CM | POA: Diagnosis not present

## 2015-08-13 ENCOUNTER — Other Ambulatory Visit: Payer: Self-pay

## 2015-08-13 DIAGNOSIS — Z1231 Encounter for screening mammogram for malignant neoplasm of breast: Secondary | ICD-10-CM

## 2015-08-15 ENCOUNTER — Encounter: Payer: Self-pay | Admitting: Internal Medicine

## 2015-08-15 ENCOUNTER — Ambulatory Visit (INDEPENDENT_AMBULATORY_CARE_PROVIDER_SITE_OTHER): Payer: Medicare Other | Admitting: Internal Medicine

## 2015-08-15 VITALS — BP 154/70 | HR 87 | Ht 66.0 in | Wt 139.2 lb

## 2015-08-15 DIAGNOSIS — R05 Cough: Secondary | ICD-10-CM | POA: Diagnosis not present

## 2015-08-15 DIAGNOSIS — R058 Other specified cough: Secondary | ICD-10-CM

## 2015-08-15 LAB — NITRIC OXIDE: Nitric Oxide: 17

## 2015-08-15 MED ORDER — PREDNISONE 10 MG PO TABS: ORAL_TABLET | ORAL | Status: DC

## 2015-08-15 MED ORDER — PANTOPRAZOLE SODIUM 40 MG PO TBEC: 40.0000 mg | DELAYED_RELEASE_TABLET | Freq: Every day | ORAL | Status: DC

## 2015-08-15 NOTE — Patient Instructions (Addendum)
Prednisone 10 Take 4 for three days 3 for three days 2 for three days 1 for three days and stop   Try pantoprazole 40 mg  Take 30-60 min before first meal of the day and Pepcid ac (famotidine) 20 mg one @  bedtime until cough is completely gone for at least a week without the need for cough suppression  For drainage / throat tickle try take CHLORPHENIRAMINE  4 mg - take one every 4 hours as needed - available over the counter- may cause drowsiness so start with just a bedtime dose or two and see how you tolerate it before trying in daytime which you should try now   GERD (REFLUX)  is an extremely common cause of respiratory symptoms just like yours , many times with no obvious heartburn at all.    It can be treated with medication, but also with lifestyle changes including elevation of the head of your bed (ideally with 6 inch  bed blocks),  Smoking cessation, avoidance of late meals, excessive alcohol, and avoid fatty foods, chocolate, peppermint, colas, red wine, and acidic juices such as orange juice.  NO MINT OR MENTHOL PRODUCTS SO NO COUGH DROPS  USE SUGARLESS CANDY INSTEAD (Jolley ranchers or Stover's or Life Savers) or even ice chips will also do - the key is to swallow to prevent all throat clearing. NO OIL BASED VITAMINS - use powdered substitutes.  If not better I will recommend you either start gabapentin 100 mg three times daily per Dr Raul Del office (or return here for it) or be referred to the Voice center at [facility] / Dr Joya Gaskins

## 2015-08-15 NOTE — Progress Notes (Signed)
Subjective:    Patient ID: Shelia Cunningham, female    DOB: [birth year], 75 y.o.   MRN: [id number]     Brief patient profile:  16 yowf never smoker with no previous resp problem whatsoever until 04/2009 with  daily cough since so referred 02/10/2012 to pulmonary by Dr Delorise Royals and resolved p rx for UACS recurred w/in a year of last eval in 2013:   History of Present Illness  02/10/2012 1st pulmonary eval cc cough daily best treatment seems to be abx but stopped fosfamax on 01/13/12 and some better since then, in general cough is more dry than wet, throat irritation, worse p stir in am and not present during heavy sleep. No better on symbicort in fact hoarseness worse, cough may be worse as well-   rec Stop symbicort and albuterol Try prilosec 20mg   Take 30-60 min before first meal of the day and Pepcid 20 mg one bedtime until cough is completely gone for at least a week without the need for cough suppression (delsym cough syrup)  GERD diet   Stay off fosfamax for now - it is available in IV form (reclast) once yearly and that might be a better choice for you if Dr Brigitte Pulse feels you need it Stop benadryl and chlortrimeton 4mg  every 4-6 hours  If cough flares return here as soon as possible for step 2 > did not return as requested    07/26/2015   Pulmonary office visit/ [name]  / reconsult Chief Complaint  Patient presents with  . Pulmonary Consult    Pt seen here last in 2013 for cough. She was referred back by Dr. Lutricia Feil. She states her cough has never completely resolved. It is some better since her last visit here in 2013. Cough is occ prod with light gray sputum.   cough tends to be every day before or after lunch and worse if lie down p lunch but not at hs (never keeps her up or wakes her up)  Had seen ent prior to last pulmonary eval abx may have helped transiently as did prednisone  Prednisone / gerd rx may have helped transiently also  No better with symbicort  Kouffman Reflux v  Neurogenic Cough Differentiator Reflux Comments  Do you awaken from a sound sleep coughing violently?                            With trouble breathing? No     Do you have choking episodes when you cannot  Get enough air, gasping for air ?              No     Do you usually cough when you lie down into  The bed, or when you just lie down to rest ?                          Varies, does cough with naps   Do you usually cough after meals or eating?         No   Do you cough when (or after) you bend over?    No   GERD SCORE     Kouffman Reflux v Neurogenic Cough Differentiator Neurogenic   Do you more-or-less cough all day long? sporadic   Does change of temperature make you cough? No   Does laughing or chuckling cause you to cough? Yes   Do fumes (perfume,  automobile fumes, burned  Toast, etc.,) cause you to cough ?      No    Does speaking, singing, or talking on the phone cause you to cough   ?               Yes    Neurogenic/Airway score    Not limited by breathing from desired activities   rec Prednisone 10 mg take  4 each am x 2 days,   2 each am x 2 days,  1 each am x 2 days and stop  Try prilosec otc 20mg   Take 30-60 min before first meal of the day and Pepcid ac (famotidine) 20 mg one @  bedtime until cough is completely gone for at least a week without the need for cough suppression For drainage / throat tickle try take CHLORPHENIRAMINE  4 mg - take one every 4 hours as needed - available over the counter- may cause drowsiness so start with just a bedtime dose or two and see how you tolerate it before trying in daytime   GERD  Diet  Substitute the calcium citrate for the carbonate    08/15/2015  f/u ov/[facility] re:  Cough since 2010  Using lots of wintergreen white lifesavers maint on ppi qam and h1/h2/hs   Chief Complaint  Patient presents with  . Follow-up    Cough had improved on pred and then worsened once finished. Cough is prod with grey/green sputum.    now stating mucus is  "grey green" peaks 11 am to 3-4pm then better   No obvious day to day or daytime variability or assoc sob or cp or chest tightness, subjective wheeze or overt sinus or hb symptoms. No unusual exp hx or h/o childhood pna/ asthma or knowledge of premature birth.  Sleeping ok without nocturnal  or early am exacerbation  of respiratory  c/o's or need for noct saba. Also denies any obvious fluctuation of symptoms with weather or environmental changes or other aggravating or alleviating factors except as outlined above   Current Medications, Allergies, Complete Past Medical History, Past Surgical History, Family History, and Social History were reviewed in [employer] record.  ROS  The following are not active complaints unless bolded sore throat, dysphagia, dental problems, itching, sneezing,  nasal congestion or excess/ purulent secretions, ear ache,   fever, chills, sweats, unintended wt loss, classically pleuritic or exertional cp, hemoptysis,  orthopnea pnd or leg swelling, presyncope, palpitations, abdominal pain, anorexia, nausea, vomiting, diarrhea  or change in bowel or bladder habits, change in stools or urine, dysuria,hematuria,  rash, arthralgias, visual complaints, headache, numbness, weakness or ataxia or problems with walking or coordination,  change in mood/affect or memory.                       Objective:   Physical Exam  amb wf slt hoarse wf nad  08/15/2015             07/26/15 139 lb 9.6 oz (63.322 kg)  10/26/12 130 lb 12.8 oz (59.33 kg)  09/23/12 135 lb (61.236 kg)    Vital signs reviewed  HEENT: nl dentition, turbinates, and oropharynx. Nl external ear canals without cough reflex   NECK :  without JVD/Nodes/TM/ nl carotid upstrokes bilaterally   LUNGS: no acc muscle use,  Nl contour chest which is clear to A and P bilaterally without cough on insp or exp maneuvers    CV:  RRR  no  s3 or murmur or increase in P2, no edema   ABD:  soft and  nontender with nl inspiratory excursion in the supine position. No bruits or organomegaly, bowel sounds nl  MS:  Nl gait/ ext warm without deformities, calf tenderness, cyanosis or clubbing No obvious joint restrictions   SKIN: warm and dry without lesions    NEURO:  alert, approp, nl sensorium with  no motor deficits         Assessment & Plan:

## 2015-08-16 ENCOUNTER — Encounter: Payer: Self-pay | Admitting: Internal Medicine

## 2015-08-16 NOTE — Assessment & Plan Note (Signed)
Spirometry 07/26/2015 wnl  - Allergy profile 07/26/2015 >  Eos 0.1 /  IgE 77 neg RAST - Sinus CT 08/02/2015 > Paranasal sinuses clear.  - NO 08/15/2015   =  17   The amt of mucus she produces is not impressive and probably just represents the nl mucus blanket that is supposed to exist and protect her.  A cough x 2010  that doesn't wake her from sleep is almost certainly irritable larynx syndrome though she does report some improvement on pred which is worth repeating and if reproducible perhaps a MCT is needed before referring to [place] /Dr Joya Gaskins at the voice center- for now will just double the duration of the pulse pred and aske her to try the 1st gen h1 in daytime if tol   I had an extended discussion with the patient reviewing all relevant studies completed to date and  lasting 15 to 20 minutes of a 25 minute visit    Each maintenance medication was reviewed in detail including most importantly the difference between maintenance and prns and under what circumstances the prns are to be triggered using an action plan format that is not reflected in the computer generated alphabetically organized AVS.    Please see instructions for details which were reviewed in writing and the patient given a copy highlighting the part that I personally wrote and discussed at today's ov.

## 2015-10-03 ENCOUNTER — Ambulatory Visit
Admission: RE | Admit: 2015-10-03 | Discharge: 2015-10-03 | Disposition: A | Discharge status: Home / Self Care | Payer: Medicare Other | Source: Ambulatory Visit

## 2015-10-03 DIAGNOSIS — Z0000 Encounter for general adult medical examination without abnormal findings: Secondary | ICD-10-CM | POA: Diagnosis not present

## 2015-10-03 DIAGNOSIS — Z1231 Encounter for screening mammogram for malignant neoplasm of breast: Secondary | ICD-10-CM | POA: Diagnosis not present

## 2015-10-03 DIAGNOSIS — M859 Disorder of bone density and structure, unspecified: Secondary | ICD-10-CM | POA: Diagnosis not present

## 2015-10-03 DIAGNOSIS — R8290 Unspecified abnormal findings in urine: Secondary | ICD-10-CM | POA: Diagnosis not present

## 2015-10-03 DIAGNOSIS — N390 Urinary tract infection, site not specified: Secondary | ICD-10-CM | POA: Diagnosis not present

## 2015-10-10 DIAGNOSIS — M545 Low back pain: Secondary | ICD-10-CM | POA: Diagnosis not present

## 2015-10-10 DIAGNOSIS — I781 Nevus, non-neoplastic: Secondary | ICD-10-CM | POA: Diagnosis not present

## 2015-10-10 DIAGNOSIS — Z6823 Body mass index (BMI) 23.0-23.9, adult: Secondary | ICD-10-CM | POA: Diagnosis not present

## 2015-10-10 DIAGNOSIS — M859 Disorder of bone density and structure, unspecified: Secondary | ICD-10-CM | POA: Diagnosis not present

## 2015-10-10 DIAGNOSIS — Z0000 Encounter for general adult medical examination without abnormal findings: Secondary | ICD-10-CM | POA: Diagnosis not present

## 2015-10-10 DIAGNOSIS — R490 Dysphonia: Secondary | ICD-10-CM | POA: Diagnosis not present

## 2015-10-10 DIAGNOSIS — R05 Cough: Secondary | ICD-10-CM | POA: Diagnosis not present

## 2015-10-10 DIAGNOSIS — G43909 Migraine, unspecified, not intractable, without status migrainosus: Secondary | ICD-10-CM | POA: Diagnosis not present

## 2015-10-10 DIAGNOSIS — M419 Scoliosis, unspecified: Secondary | ICD-10-CM | POA: Diagnosis not present

## 2015-10-10 DIAGNOSIS — Z1389 Encounter for screening for other disorder: Secondary | ICD-10-CM | POA: Diagnosis not present

## 2015-11-04 ENCOUNTER — Telehealth: Payer: Self-pay | Admitting: Internal Medicine

## 2015-11-04 NOTE — Telephone Encounter (Signed)
Called spoke with pt. Pt c/o hoariness, occ prod cough with yellow/green colored mucus, PND, and wheezing which has been going on for 7 years. Denies any SOB, fever, nausea or vomiting. I offered the pt an ov with MW on 11/08/15. She voiced understanding and had no further questions.

## 2015-11-08 ENCOUNTER — Ambulatory Visit (INDEPENDENT_AMBULATORY_CARE_PROVIDER_SITE_OTHER): Payer: Medicare Other | Admitting: Internal Medicine

## 2015-11-08 ENCOUNTER — Encounter: Payer: Self-pay | Admitting: Internal Medicine

## 2015-11-08 ENCOUNTER — Ambulatory Visit (INDEPENDENT_AMBULATORY_CARE_PROVIDER_SITE_OTHER)
Admission: RE | Admit: 2015-11-08 | Discharge: 2015-11-08 | Disposition: A | Discharge status: Home / Self Care | Payer: Medicare Other | Source: Ambulatory Visit | Attending: Internal Medicine | Admitting: Internal Medicine

## 2015-11-08 ENCOUNTER — Telehealth: Payer: Self-pay | Admitting: *Deleted

## 2015-11-08 VITALS — BP 122/60 | HR 84 | Ht 66.0 in | Wt 140.6 lb

## 2015-11-08 DIAGNOSIS — R938 Abnormal findings on diagnostic imaging of other specified body structures: Secondary | ICD-10-CM | POA: Diagnosis not present

## 2015-11-08 DIAGNOSIS — R05 Cough: Secondary | ICD-10-CM

## 2015-11-08 DIAGNOSIS — R9389 Abnormal findings on diagnostic imaging of other specified body structures: Secondary | ICD-10-CM

## 2015-11-08 DIAGNOSIS — R058 Other specified cough: Secondary | ICD-10-CM

## 2015-11-08 DIAGNOSIS — R079 Chest pain, unspecified: Secondary | ICD-10-CM | POA: Diagnosis not present

## 2015-11-08 MED ORDER — GABAPENTIN 100 MG PO CAPS: 100.0000 mg | ORAL_CAPSULE | Freq: Three times a day (TID) | ORAL | Status: DC

## 2015-11-08 NOTE — Patient Instructions (Addendum)
Please remember to go to the x-ray department downstairs for your tests - we will call you with the results when they are available.  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 Late add: needs HRCT to eval abn cxr ? bronchiectasis

## 2015-11-08 NOTE — Telephone Encounter (Signed)
Aware- see result note  

## 2015-11-08 NOTE — Telephone Encounter (Signed)
Call report on pt CXR today. IMPRESSION: Mild left basilar opacity is noted concerning for subsegmental atelectasis or possibly infiltrate. Irregular nodular density is seen laterally in right midlung; while this may simply represent scarring, neoplasm or malignancy cannot be excluded, and CT scan of the chest is recommended for further evaluation. These results will be called to the ordering clinician or representative by the Radiologist Assistant, and communication documented in the PACS or zVision Dashboard  Please advise MW thanks

## 2015-11-08 NOTE — Progress Notes (Signed)
Subjective:    Patient ID: Shelia Cunningham, female    DOB: 1940-09-04     MRN: [id number]     Brief patient profile:  58 yowf never smoker with no previous resp problem whatsoever until 04/2009 with  daily cough since so referred 02/10/2012 to pulmonary by Shelia Cunningham and resolved p rx for UACS recurred w/in a year of last eval in 2013:   History of Present Illness  02/10/2012 1st pulmonary eval cc cough daily best treatment seems to be abx but stopped fosfamax on 01/13/12 and some better since then, in general cough is more dry than wet, throat irritation, worse p stir in am and not present during heavy sleep. No better on symbicort in fact hoarseness worse, cough may be worse as well-   rec Stop symbicort and albuterol Try prilosec 20mg   Take 30-60 min before first meal of the day and Pepcid 20 mg one bedtime until cough is completely gone for at least a week without the need for cough suppression (delsym cough syrup)  GERD diet   Stay off fosfamax for now - it is available in IV form (reclast) once yearly and that might be a better choice for you if Shelia Cunningham feels you need it Stop benadryl and chlortrimeton 4mg  every 4-6 hours  If cough flares return here as soon as possible for step 2 > did not return as requested    07/26/2015   Pulmonary office visit/ Shelia Cunningham  / reconsult Chief Complaint  Patient presents with  . Pulmonary Consult    Pt seen here last in 2013 for cough. She was referred back by Shelia. Lutricia Cunningham. She states her cough has never completely resolved. It is some better since her last visit here in 2013. Cough is occ prod with light gray sputum.   cough tends to be every day before or after lunch and worse if lie down p lunch but not at hs (never keeps her up or wakes her up)  Had seen ent prior to last pulmonary eval abx may have helped transiently as did prednisone  Prednisone / gerd rx may have helped transiently also  No better with symbicort  Kouffman Reflux v Neurogenic  Cough Differentiator Reflux Comments  Do you awaken from a sound sleep coughing violently?                            With trouble breathing? No     Do you have choking episodes when you cannot  Get enough air, gasping for air ?              No     Do you usually cough when you lie down into  The bed, or when you just lie down to rest ?                          Varies, does cough with naps   Do you usually cough after meals or eating?         No   Do you cough when (or after) you bend over?    No   GERD SCORE     Kouffman Reflux v Neurogenic Cough Differentiator Neurogenic   Do you more-or-less cough all day long? sporadic   Does change of temperature make you cough? No   Does laughing or chuckling cause you to cough? Yes   Do fumes (perfume,  automobile fumes, burned  Toast, etc.,) cause you to cough ?      No    Does speaking, singing, or talking on the phone cause you to cough   ?               Yes    Neurogenic/Airway score    Not limited by breathing from desired activities   rec Prednisone 10 mg take  4 each am x 2 days,   2 each am x 2 days,  1 each am x 2 days and stop  Try prilosec otc 20mg   Take 30-60 min before first meal of the day and Pepcid ac (famotidine) 20 mg one @  bedtime until cough is completely gone for at least a week without the need for cough suppression For drainage / throat tickle try take CHLORPHENIRAMINE  4 mg - take one every 4 hours as needed - available over the counter- may cause drowsiness so start with just a bedtime dose or two and see how you tolerate it before trying in daytime   GERD  Diet  Substitute the calcium citrate for the carbonate    08/15/2015  f/u ov/Shelia Cunningham re:  Cough since 2010  Using lots of wintergreen white lifesavers maint on ppi qam and h1/h2/hs   Chief Complaint  Patient presents with  . Follow-up    Cough had improved on pred and then worsened once finished. Cough is prod with grey/green sputum.    now stating mucus is "grey green"  peaks 11 am to 3-4pm then better  rec Prednisone 10 Take 4 for three days 3 for three days 2 for three days 1 for three days and stop  Try pantoprazole 40 mg  Take 30-60 min before first meal of the day and Pepcid ac (famotidine) 20 mg one @  bedtime until cough is completely gone for at least a week without the need for cough suppression For drainage / throat tickle try take CHLORPHENIRAMINE  4 mg - take one every 4 hours as needed -  GERD (REFLUX) If not better I will recommend you either start gabapentin 100 mg three times daily per Shelia Cunningham office (or return here for it) or be referred to the Voice center at [facility] / Shelia Cunningham    11/08/2015  f/u ov/Shelia Cunningham re: cough since 2010  Chief Complaint  Patient presents with  . Follow-up    pt states cough has slightly improved. c/o prod cough w/gray mucus, wheezing, occ sob, when laying on rt side rt lung feels compress    No obvious day to day or daytime variability or assoc  cp or chest tightness,  overt sinus or hb symptoms. No unusual exp hx or h/o childhood pna/ asthma or knowledge of premature birth.  Sleeping ok without nocturnal  or early am exacerbation  of respiratory  c/o's or need for noct saba. Also denies any obvious fluctuation of symptoms with weather or environmental changes or other aggravating or alleviating factors except as outlined above   Current Medications, Allergies, Complete Past Medical History, Past Surgical History, Family History, and Social History were reviewed in [employer] record.  ROS  The following are not active complaints unless bolded sore throat, dysphagia, dental problems, itching, sneezing,  nasal congestion or excess/ purulent secretions, ear ache,   fever, chills, sweats, unintended wt loss, classically pleuritic or exertional cp, hemoptysis,  orthopnea pnd or leg swelling, presyncope, palpitations, abdominal pain, anorexia, nausea, vomiting, diarrhea  or  change in bowel or bladder  habits, change in stools or urine, dysuria,hematuria,  rash, arthralgias, visual complaints, headache, numbness, weakness or ataxia or problems with walking or coordination,  change in mood/affect or memory.                       Objective:   Physical Exam  amb wf slt hoarse wf nad with prominent pseudowheeze    11/08/2015     141  08/15/2015             07/26/15 139 lb 9.6 oz (63.322 kg)  10/26/12 130 lb 12.8 oz (59.33 kg)  09/23/12 135 lb (61.236 kg)    Vital signs reviewed  HEENT: nl dentition, turbinates, and oropharynx. Nl external ear canals without cough reflex   NECK :  without JVD/Nodes/TM/ nl carotid upstrokes bilaterally   LUNGS: no acc muscle use,  Nl contour chest which is clear to A and P bilaterally without cough on insp or exp maneuvers    CV:  RRR  no s3 or murmur or increase in P2, no edema   ABD:  soft and nontender with nl inspiratory excursion in the supine position. No bruits or organomegaly, bowel sounds nl  MS:  Nl gait/ ext warm without deformities, calf tenderness, cyanosis or clubbing No obvious joint restrictions   SKIN: warm and dry without lesions    NEURO:  alert, approp, nl sensorium with  no motor deficits     CXR PA and Lateral:   11/08/2015 :    I personally reviewed images and agree with radiology impression as follows:    Mild left basilar opacity is noted concerning for subsegmental atelectasis or possibly infiltrate. Irregular nodular density is seen laterally in right midlung; while this may simply represent scarring, neoplasm or malignancy cannot be excluded, and CT scan of the chest is recommended for further evaluation    Assessment & Plan:

## 2015-11-09 ENCOUNTER — Encounter: Payer: Self-pay | Admitting: Internal Medicine

## 2015-11-09 DIAGNOSIS — A310 Pulmonary mycobacterial infection: Secondary | ICD-10-CM | POA: Insufficient documentation

## 2015-11-09 NOTE — Assessment & Plan Note (Signed)
HRCT rec 11/09/2015 >>>   If she does have bronchiectasis it would still not explain the pseudowheeze and throat clearing which are clearly upper airway in origin

## 2015-11-09 NOTE — Assessment & Plan Note (Signed)
Spirometry 07/26/2015 wnl  - Allergy profile 07/26/2015 >  Eos 0.1 /  IgE 77 neg RAST - Sinus CT 08/02/2015 > Paranasal sinuses clear.  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 so reasonable to try max gerd rx plus neurontin in low doses  The issue of bronchiectasis is raised by the cxr and report of discolored mucus worse if lying on R side > HRCT next step  I had an extended discussion with the patient reviewing all relevant studies completed to date and  lasting 15 to 20 minutes of a 25 minute visit    Each maintenance medication was reviewed in detail including most importantly the difference between maintenance and prns and under what circumstances the prns are to be triggered using an action plan format that is not reflected in the computer generated alphabetically organized AVS.    Please see instructions for details which were reviewed in writing and the patient given a copy highlighting the part that I personally wrote and discussed at today's ov.

## 2015-11-11 ENCOUNTER — Telehealth: Payer: Self-pay

## 2015-11-11 NOTE — Telephone Encounter (Signed)
Pt seen MW in office on 11/08/15. Pt wanted to know if she needed to continue with her current medications plus the new ones. MW was already in a room with another pt so I informed pt that I would confirm with MW and would give her a call.  Dr. [name] has advised that she continue all of her current medications including new ones until cough is clear for at least one week. LMOVM  Will await call back

## 2015-11-14 NOTE — Progress Notes (Signed)
Quick Note:  lmtcb ______ 

## 2015-11-15 NOTE — Progress Notes (Signed)
Quick Note:  LMTCB ______ 

## 2015-11-18 ENCOUNTER — Other Ambulatory Visit: Payer: Self-pay | Admitting: Internal Medicine

## 2015-11-20 NOTE — Progress Notes (Signed)
Quick Note:  D/w pt at ov ______ 

## 2016-01-06 ENCOUNTER — Telehealth: Payer: Self-pay | Admitting: Internal Medicine

## 2016-01-06 NOTE — Telephone Encounter (Signed)
Increase the neurontin to 200 tid x 2 weeks and then ov with all meds in hand to see me or NP

## 2016-01-06 NOTE — Telephone Encounter (Signed)
Spoke with pt. States that when she saw MW last he started her on Gabapentin. Reports that she has been taking this medication for 2 months with little improvement. Pt would like to know what MW suggests that she do from here.  MW - please advise. Thanks.

## 2016-01-06 NOTE — Telephone Encounter (Signed)
Called and spoke with pt and she is aware of MW recs to increase the gabapentin 200 tid and to follow up in 2 weeks with all meds in hand.  appt scheduled for pt with MW on 9/15 at 945.  Nothing further is needed.

## 2016-01-21 ENCOUNTER — Other Ambulatory Visit: Payer: Self-pay | Admitting: Internal Medicine

## 2016-01-24 ENCOUNTER — Ambulatory Visit (INDEPENDENT_AMBULATORY_CARE_PROVIDER_SITE_OTHER): Payer: Medicare Other | Admitting: Internal Medicine

## 2016-01-24 ENCOUNTER — Encounter: Payer: Self-pay | Admitting: Internal Medicine

## 2016-01-24 VITALS — BP 112/72 | HR 79 | Ht 66.0 in | Wt 140.4 lb

## 2016-01-24 DIAGNOSIS — R938 Abnormal findings on diagnostic imaging of other specified body structures: Secondary | ICD-10-CM | POA: Diagnosis not present

## 2016-01-24 DIAGNOSIS — R9389 Abnormal findings on diagnostic imaging of other specified body structures: Secondary | ICD-10-CM

## 2016-01-24 DIAGNOSIS — R058 Other specified cough: Secondary | ICD-10-CM

## 2016-01-24 DIAGNOSIS — R05 Cough: Secondary | ICD-10-CM

## 2016-01-24 MED ORDER — GABAPENTIN 300 MG PO CAPS: 300.0000 mg | ORAL_CAPSULE | Freq: Three times a day (TID) | ORAL | 2 refills | Status: DC

## 2016-01-24 NOTE — Patient Instructions (Signed)
Increase neurontin 300 mg three times a day   For cough > mucinex dm 1200 mg in  Am / another in evening as needed  For drainage / throat tickle try take CHLORPHENIRAMINE  4 mg - take one every 4 hours as needed - available over the counter- may cause drowsiness so start with just a bedtime dose or two and see how you tolerate it before trying in daytime    Keep the candy handy and no other changes in medications   Please see patient coordinator before you leave today  to schedule HRCT chest   Please schedule a follow up office visit in 6 weeks, call sooner if needed

## 2016-01-24 NOTE — Progress Notes (Signed)
Subjective:    Patient ID: Shelia Cunningham, female    DOB: 12/10/1940     MRN: [id number]     Brief patient profile:  62 yowf never smoker with no previous resp problem whatsoever until 04/2009 with  daily cough since so referred 02/10/2012 to pulmonary by Dr Lang Snow and resolved p rx for UACS recurred w/in a year of last eval in 2013:   History of Present Illness  02/10/2012 1st pulmonary eval cc cough daily best treatment seems to be abx but stopped fosfamax on 01/13/12 and some better since then, in general cough is more dry than wet, throat irritation, worse p stir in am and not present during heavy sleep. No better on symbicort in fact hoarseness worse, cough may be worse as well-   rec Stop symbicort and albuterol Try prilosec 20mg   Take 30-60 min before first meal of the day and Pepcid 20 mg one bedtime until cough is completely gone for at least a week without the need for cough suppression (delsym cough syrup)  GERD diet   Stay off fosfamax for now - it is available in IV form (reclast) once yearly and that might be a better choice for you if Dr Brigitte Pulse feels you need it Stop benadryl and chlortrimeton 4mg  every 4-6 hours  If cough flares return here as soon as possible for step 2 > did not return as requested    07/26/2015   Pulmonary office visit/ Shelia Cunningham  / reconsult Chief Complaint  Patient presents with  . Pulmonary Consult    Pt seen here last in 2013 for cough. She was referred back by Dr. Lutricia Feil. She states her cough has never completely resolved. It is some better since her last visit here in 2013. Cough is occ prod with light gray sputum.   cough tends to be every day before or after lunch and worse if lie down p lunch but not at hs (never keeps her up or wakes her up)  Had seen ent prior to last pulmonary eval abx may have helped transiently as did prednisone  Prednisone / gerd rx may have helped transiently also  No better with symbicort  Kouffman Reflux v Neurogenic  Cough Differentiator Reflux Comments  Do you awaken from a sound sleep coughing violently?                            With trouble breathing? No     Do you have choking episodes when you cannot  Get enough air, gasping for air ?              No     Do you usually cough when you lie down into  The bed, or when you just lie down to rest ?                          Varies, does cough with naps   Do you usually cough after meals or eating?         No   Do you cough when (or after) you bend over?    No   GERD SCORE     Kouffman Reflux v Neurogenic Cough Differentiator Neurogenic   Do you more-or-less cough all day long? sporadic   Does change of temperature make you cough? No   Does laughing or chuckling cause you to cough? Yes   Do fumes (perfume,  automobile fumes, burned  Toast, etc.,) cause you to cough ?      No    Does speaking, singing, or talking on the phone cause you to cough   ?               Yes    Neurogenic/Airway score    Not limited by breathing from desired activities   rec Prednisone 10 mg take  4 each am x 2 days,   2 each am x 2 days,  1 each am x 2 days and stop  Try prilosec otc 20mg   Take 30-60 min before first meal of the day and Pepcid ac (famotidine) 20 mg one @  bedtime until cough is completely gone for at least a week without the need for cough suppression For drainage / throat tickle try take CHLORPHENIRAMINE  4 mg - take one every 4 hours as needed - available over the counter- may cause drowsiness so start with just a bedtime dose or two and see how you tolerate it before trying in daytime   GERD  Diet  Substitute the calcium citrate for the carbonate    08/15/2015  f/u ov/Shelia Cunningham re:  Cough since 2010  Using lots of wintergreen white lifesavers maint on ppi qam and h1/h2/hs   Chief Complaint  Patient presents with  . Follow-up    Cough had improved on pred and then worsened once finished. Cough is prod with grey/green sputum.    now stating mucus is "grey green"  peaks 11 am to 3-4pm then better  rec Prednisone 10 Take 4 for three days 3 for three days 2 for three days 1 for three days and stop  Try pantoprazole 40 mg  Take 30-60 min before first meal of the day and Pepcid ac (famotidine) 20 mg one @  bedtime until cough is completely gone for at least a week without the need for cough suppression For drainage / throat tickle try take CHLORPHENIRAMINE  4 mg - take one every 4 hours as needed -  GERD (REFLUX) If not better I will recommend you either start gabapentin 100 mg three times daily per Dr Raul Del office (or return here for it) or be referred to the Voice center at [facility] / Dr Joya Gaskins    11/08/2015  f/u ov/Shelia Cunningham re: cough since 2010  Chief Complaint  Patient presents with  . Follow-up    pt states cough has slightly improved. c/o prod cough w/gray mucus, wheezing, occ sob, when laying on rt side rt lung feels compress   rec Please remember to go to the x-ray department downstairs for your tests - we will call you with the results when they are available. Neurontin  100 mg three times  For irritable larynx   If not satisfied call for referral [facility] Voice center. Late add: needs HRCT to eval abn cxr ? Bronchiectasis    Phone call  01/06/16 no better so rec 200 three times day     01/24/2016  f/u ov/Shelia Cunningham re: cough since 2010/ abn cxr maint on max gerd rx / gabapentin 200 tid  Chief Complaint  Patient presents with  . Follow-up    Cough is "maybe just a little better"- still has "spells" mid afternoon and triggered by talking or laughing.   min cough at bedtime and then sleeps fine p benadryl at hs  Mucus is most likely mid day x glob of yellowish gray  Not limited by breathing from desired activities  No obvious day to day or daytime variability or assoc  cp or chest tightness,  overt sinus or hb symptoms. No unusual exp hx or h/o childhood pna/ asthma or knowledge of premature birth.  Sleeping ok without nocturnal  or early am  exacerbation  of respiratory  c/o's or need for noct saba. Also denies any obvious fluctuation of symptoms with weather or environmental changes or other aggravating or alleviating factors except as outlined above   Current Medications, Allergies, Complete Past Medical History, Past Surgical History, Family History, and Social History were reviewed in [employer] record.  ROS  The following are not active complaints unless bolded sore throat, dysphagia, dental problems, itching, sneezing,  nasal congestion or excess/ purulent secretions, ear ache,   fever, chills, sweats, unintended wt loss, classically pleuritic or exertional cp, hemoptysis,  orthopnea pnd or leg swelling, presyncope, palpitations, abdominal pain, anorexia, nausea, vomiting, diarrhea  or change in bowel or bladder habits, change in stools or urine, dysuria,hematuria,  rash, arthralgias, visual complaints, headache, numbness, weakness or ataxia or problems with walking or coordination,  change in mood/affect or memory.                       Objective:   Physical Exam  amb wf slt hoarse wf nad     01/24/2016       140  11/08/2015     141  08/15/2015             07/26/15 139 lb 9.6 oz (63.322 kg)  10/26/12 130 lb 12.8 oz (59.33 kg)  09/23/12 135 lb (61.236 kg)    Vital signs reviewed  HEENT: nl dentition, turbinates, and oropharynx. Nl external ear canals without cough reflex   NECK :  without JVD/Nodes/TM/ nl carotid upstrokes bilaterally   LUNGS: no acc muscle use,  Nl contour chest which is clear to A and P bilaterally without cough on insp or exp maneuvers    CV:  RRR  no s3 or murmur or increase in P2, no edema   ABD:  soft and nontender with nl inspiratory excursion in the supine position. No bruits or organomegaly, bowel sounds nl  MS:  Nl gait/ ext warm without deformities, calf tenderness, cyanosis or clubbing No obvious joint restrictions   SKIN: warm and dry without lesions      NEURO:  alert, approp, nl sensorium with  no motor deficits         Assessment & Plan:

## 2016-01-26 NOTE — Assessment & Plan Note (Signed)
HRCT of Chest pending

## 2016-01-26 NOTE — Assessment & Plan Note (Signed)
Spirometry 07/26/2015 wnl  - Allergy profile 07/26/2015 >  Eos 0.1 /  IgE 77 neg RAST - Sinus CT 08/02/2015 > Paranasal sinuses clear.  - NO 08/15/2015   =  17  - neurontin trial  100 mg tid 11/08/2015 > increased to 200 on 01/06/16 > min better - neurontin 300 tid 01/24/2016 >>>    - 01/24/2016 added 1st gen h1 and mucinex dm - HRCT chest 01/24/2016 >>>   Lack of cough resolution on a verified empirical regimen could mean an alternative diagnosis, persistence of the disease state (eg sinusitis which we already ruled out or bronchiectasis which we haven't) , or inadequacy of currently available therapy (eg no medical rx available for non-acid gerd)   She seems a little better on gabapentin and tol well so rec push to 300 tid and proceed with HRCT chest  I had an extended discussion with the patient reviewing all relevant studies completed to date and  lasting 15 to 20 minutes of a 25 minute visit    Each maintenance medication was reviewed in detail including most importantly the difference between maintenance and prns and under what circumstances the prns are to be triggered using an action plan format that is not reflected in the computer generated alphabetically organized AVS.    Please see instructions for details which were reviewed in writing and the patient given a copy highlighting the part that I personally wrote and discussed at today's ov.

## 2016-01-31 ENCOUNTER — Ambulatory Visit (INDEPENDENT_AMBULATORY_CARE_PROVIDER_SITE_OTHER)
Admission: RE | Admit: 2016-01-31 | Discharge: 2016-01-31 | Disposition: A | Discharge status: Home / Self Care | Payer: Medicare Other | Source: Ambulatory Visit | Attending: Internal Medicine | Admitting: Internal Medicine

## 2016-01-31 DIAGNOSIS — R05 Cough: Secondary | ICD-10-CM | POA: Diagnosis not present

## 2016-01-31 DIAGNOSIS — R058 Other specified cough: Secondary | ICD-10-CM

## 2016-02-03 ENCOUNTER — Other Ambulatory Visit: Payer: Self-pay | Admitting: Internal Medicine

## 2016-02-03 ENCOUNTER — Telehealth: Payer: Self-pay | Admitting: Internal Medicine

## 2016-02-03 NOTE — Telephone Encounter (Signed)
Called spoke with pt. Informed her that we have not received the results and recs from MW at this time. I explained to her that once we receive the results in epic we would return her call. He voiced understanding and had no further questions. Nothing further needed at this time.

## 2016-02-05 ENCOUNTER — Telehealth: Payer: Self-pay | Admitting: Internal Medicine

## 2016-02-05 DIAGNOSIS — H2513 Age-related nuclear cataract, bilateral: Secondary | ICD-10-CM | POA: Diagnosis not present

## 2016-02-05 NOTE — Telephone Encounter (Signed)
I had forgotten about the oct appt when we called about the ct chest so she should keep the Oct 27 one

## 2016-02-05 NOTE — Progress Notes (Signed)
LMTCB

## 2016-02-05 NOTE — Telephone Encounter (Signed)
Called and spoke with pt and she is aware to keep the appt on oct 27.

## 2016-02-05 NOTE — Telephone Encounter (Signed)
Tanda Rockers, MD  Rosana Berger, [place]        Call patient : Crist Fat is good news, no Pulmonary fibrosis, minimal changes we can discuss in more detail at next ov - set up for 3 months is fine if doing well  ----  I spoke with patient about results and she verbalized understanding and had no questions. Pt is scheduled to come back in 10/27 for 6 week follow up. Pt reports this is too follow up on the neurotin. Requesting clarification on when exactly she should return. Please advise thanks   Increase neurontin 300 mg three times a day    For cough > mucinex dm 1200 mg in  Am / another in evening as needed   For drainage / throat tickle try take CHLORPHENIRAMINE  4 mg - take one every 4 hours as needed - available over the counter- may cause drowsiness so start with just a bedtime dose or two and see how you tolerate it before trying in daytime     Keep the candy handy and no other changes in medications    Please see patient coordinator before you leave today  to schedule HRCT chest    Please schedule a follow up office visit in 6 weeks, call sooner if needed

## 2016-03-06 ENCOUNTER — Encounter: Payer: Self-pay | Admitting: Internal Medicine

## 2016-03-06 ENCOUNTER — Ambulatory Visit (INDEPENDENT_AMBULATORY_CARE_PROVIDER_SITE_OTHER): Payer: Medicare Other | Admitting: Internal Medicine

## 2016-03-06 VITALS — BP 124/68 | HR 81 | Ht 66.0 in | Wt 140.0 lb

## 2016-03-06 DIAGNOSIS — R05 Cough: Secondary | ICD-10-CM | POA: Diagnosis not present

## 2016-03-06 DIAGNOSIS — R058 Other specified cough: Secondary | ICD-10-CM

## 2016-03-06 DIAGNOSIS — I209 Angina pectoris, unspecified: Secondary | ICD-10-CM

## 2016-03-06 DIAGNOSIS — A310 Pulmonary mycobacterial infection: Secondary | ICD-10-CM

## 2016-03-06 MED ORDER — LEVOFLOXACIN 500 MG PO TABS: 500.0000 mg | ORAL_TABLET | Freq: Every day | ORAL | 0 refills | Status: DC

## 2016-03-06 NOTE — Patient Instructions (Addendum)
levaquin 500 mg daily x 10 days  mucinex dm is as needed only   Please schedule a follow up visit in 3 months but call sooner if needed with cxr on return  Late add: refer to cards re throat pain with ex

## 2016-03-06 NOTE — Progress Notes (Signed)
Subjective:    Patient ID: Shelia Cunningham, female    DOB: 02/06/1941     MRN: [id number]     Brief patient profile:  61 yowf never smoker with no previous resp problem whatsoever until 04/2009 with  daily cough since so referred 02/10/2012 to pulmonary by Dr Lang Snow and resolved p rx for UACS recurred w/in a year of last eval in 2013:   History of Present Illness  02/10/2012 1st pulmonary eval cc cough daily best treatment seems to be abx but stopped fosfamax on 01/13/12 and some better since then, in general cough is more dry than wet, throat irritation, worse p stir in am and not present during heavy sleep. No better on symbicort in fact hoarseness worse, cough may be worse as well-   rec Stop symbicort and albuterol Try prilosec 20mg   Take 30-60 min before first meal of the day and Pepcid 20 mg one bedtime until cough is completely gone for at least a week without the need for cough suppression (delsym cough syrup)  GERD diet   Stay off fosfamax for now - it is available in IV form (reclast) once yearly and that might be a better choice for you if Dr Brigitte Pulse feels you need it Stop benadryl and chlortrimeton 4mg  every 4-6 hours  If cough flares return here as soon as possible for step 2 > did not return as requested    07/26/2015   Pulmonary office visit/ [name]  / reconsult Chief Complaint  Patient presents with  . Pulmonary Consult    Pt seen here last in 2013 for cough. She was referred back by Dr. Lutricia Feil. She states her cough has never completely resolved. It is some better since her last visit here in 2013. Cough is occ prod with light gray sputum.   cough tends to be every day before or after lunch and worse if lie down p lunch but not at hs (never keeps her up or wakes her up)  Had seen ent prior to last pulmonary eval abx may have helped transiently as did prednisone  Prednisone / gerd rx may have helped transiently also  No better with symbicort  Kouffman Reflux v Neurogenic  Cough Differentiator Reflux Comments  Do you awaken from a sound sleep coughing violently?                            With trouble breathing? No     Do you have choking episodes when you cannot  Get enough air, gasping for air ?              No     Do you usually cough when you lie down into  The bed, or when you just lie down to rest ?                          Varies, does cough with naps   Do you usually cough after meals or eating?         No   Do you cough when (or after) you bend over?    No   GERD SCORE     Kouffman Reflux v Neurogenic Cough Differentiator Neurogenic   Do you more-or-less cough all day long? sporadic   Does change of temperature make you cough? No   Does laughing or chuckling cause you to cough? Yes   Do fumes (perfume,  automobile fumes, burned  Toast, etc.,) cause you to cough ?      No    Does speaking, singing, or talking on the phone cause you to cough   ?               Yes    Neurogenic/Airway score    Not limited by breathing from desired activities   rec Prednisone 10 mg take  4 each am x 2 days,   2 each am x 2 days,  1 each am x 2 days and stop  Try prilosec otc 20mg   Take 30-60 min before first meal of the day and Pepcid ac (famotidine) 20 mg one @  bedtime until cough is completely gone for at least a week without the need for cough suppression For drainage / throat tickle try take CHLORPHENIRAMINE  4 mg - take one every 4 hours as needed - available over the counter- may cause drowsiness so start with just a bedtime dose or two and see how you tolerate it before trying in daytime   GERD  Diet  Substitute the calcium citrate for the carbonate    08/15/2015  f/u ov/Seniyah Esker re:  Cough since 2010  Using lots of wintergreen white lifesavers maint on ppi qam and h1/h2/hs   Chief Complaint  Patient presents with  . Follow-up    Cough had improved on pred and then worsened once finished. Cough is prod with grey/green sputum.    now stating mucus is "grey green"  peaks 11 am to 3-4pm then better  rec Prednisone 10 Take 4 for three days 3 for three days 2 for three days 1 for three days and stop  Try pantoprazole 40 mg  Take 30-60 min before first meal of the day and Pepcid ac (famotidine) 20 mg one @  bedtime until cough is completely gone for at least a week without the need for cough suppression For drainage / throat tickle try take CHLORPHENIRAMINE  4 mg - take one every 4 hours as needed -  GERD (REFLUX) If not better I will recommend you either start gabapentin 100 mg three times daily per Dr Raul Del office (or return here for it) or be referred to the Voice center at [facility] / Dr Joya Gaskins    11/08/2015  f/u ov/Delphin Funes re: cough since 2010  Chief Complaint  Patient presents with  . Follow-up    pt states cough has slightly improved. c/o prod cough w/gray mucus, wheezing, occ sob, when laying on rt side rt lung feels compress   rec Please remember to go to the x-ray department downstairs for your tests - we will call you with the results when they are available. 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 Late add: needs HRCT to eval abn cxr ? Bronchiectasis    Phone call  01/06/16 no better so rec 200 three times day     01/24/2016  f/u ov/Shaolin Armas re: cough since 2010/ abn cxr maint on max gerd rx / gabapentin 200 tid  Chief Complaint  Patient presents with  . Follow-up    Cough is "maybe just a little better"- still has "spells" mid afternoon and triggered by talking or laughing.   min cough at bedtime and then sleeps fine p benadryl at hs  Mucus is most likely mid day x glob of yellowish gray  Not limited by breathing from desired activities  rec Increase neurontin 300 mg three times a day  For cough > mucinex dm 1200 mg in  Am / another in evening as needed For drainage / throat tickle try take CHLORPHENIRAMINE  4 mg - take one every 4 hours as needed - available over the  counter- may cause drowsiness so start with just a bedtime dose or two and see how you tolerate it before trying in daytime   Keep the candy handy and no other changes in medications  Please see patient coordinator before you leave today  to schedule HRCT chest     03/06/2016  f/u ov/Jerrell Mangel re:  Chronic cough/  prob MAI  Chief Complaint  Patient presents with  . Follow-up    pt states cough has slightly improved. pt c/o prod cough with yellow mucus & throat clearing.   says every time she gets abx it knocks out the discolored sputum only to return w/in weeks  New onset x sev weeks of reproducible c/o throat pain with exertion, resolves within a few min of rest, never with nausea, diaph or cp    No obvious day to day or daytime variability or assoc   chest tightness,  overt sinus or hb symptoms. No unusual exp hx or h/o childhood pna/ asthma or knowledge of premature birth.  Sleeping ok without nocturnal  or early am exacerbation  of respiratory  c/o's or need for noct saba. Also denies any obvious fluctuation of symptoms with weather or environmental changes or other aggravating or alleviating factors except as outlined above   Current Medications, Allergies, Complete Past Medical History, Past Surgical History, Family History, and Social History were reviewed in [employer] record.  ROS  The following are not active complaints unless bolded sore throat, dysphagia, dental problems, itching, sneezing,  nasal congestion or excess/ purulent secretions, ear ache,   fever, chills, sweats, unintended wt loss, classically pleuritic or exertional cp, hemoptysis,  orthopnea pnd or leg swelling, presyncope, palpitations, abdominal pain, anorexia, nausea, vomiting, diarrhea  or change in bowel or bladder habits, change in stools or urine, dysuria,hematuria,  rash, arthralgias, visual complaints, headache, numbness, weakness or ataxia or problems with walking or coordination,  change  in mood/affect or memory.                 Objective:   Physical Exam  amb wf slt hoarse wf nad     03/06/2016     140  01/24/2016       140  11/08/2015     141  08/15/2015             07/26/15 139 lb 9.6 oz (63.322 kg)  10/26/12 130 lb 12.8 oz (59.33 kg)  09/23/12 135 lb (61.236 kg)    Vital signs reviewed  - Note on arrival 02 sats  96% on RA   HEENT: nl dentition, turbinates, and oropharynx. Nl external ear canals without cough reflex   NECK :  without JVD/Nodes/TM/ nl carotid upstrokes bilaterally   LUNGS: no acc muscle use,  Nl contour chest which is clear to A and P bilaterally without cough on insp or exp maneuvers    CV:  RRR  no s3 or murmur or increase in P2, no edema   ABD:  soft and nontender with nl inspiratory excursion in the supine position. No bruits or organomegaly, bowel sounds nl  MS:  Nl gait/ ext warm without deformities, calf tenderness, cyanosis or clubbing No obvious joint restrictions  SKIN: warm and dry without lesions    NEURO:  alert, approp, nl sensorium with  no motor deficits      I personally reviewed images and agree with radiology impression as follows:   HRCT Chest   01/31/16  1. No evidence to suggest interstitial lung disease. 2. 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      Assessment & Plan:

## 2016-03-08 DIAGNOSIS — I209 Angina pectoris, unspecified: Secondary | ICD-10-CM | POA: Insufficient documentation

## 2016-03-08 NOTE — Assessment & Plan Note (Signed)
Spirometry 07/26/2015 wnl  - Allergy profile 07/26/2015 >  Eos 0.1 /  IgE 77 neg RAST - Sinus CT 08/02/2015 > Paranasal sinuses clear.  - NO 08/15/2015   =  17  - neurontin trial  100 mg tid 11/08/2015 > increased to 200 on 01/06/16 > min better - neurontin 300 tid 01/24/2016 >>>  - 01/24/2016 added 1st gen h1 and mucinex dm and increased to 300 tid  - HRCT chest 01/24/2016 >>> see MAI   The upper airway component seems some better but the productive component is likely mai > see Fernande Boyden

## 2016-03-08 NOTE — Assessment & Plan Note (Signed)
Onset 01/2016 > referred to cards 03/06/2016

## 2016-03-08 NOTE — Assessment & Plan Note (Signed)
HRCT 01/31/16 1) No evidence to suggest interstitial lung disease. 2. 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 - rec trial of levaquin 500 mg x 10 days 03/06/2016 >>>  This is an extremely common benign condition in the elderly and does not warrant aggressive eval/ rx at this point unless there is a clinical correlation suggesting unaddressed pulmonary infection (purulent sputum, night sweats, unintended wt loss, doe) or evolution of  obvious changes on plain cxr (as opposed to serial CT, which is way over sensitive to make clinical decisions re intervention and treatment in the elderly, who tend to tolerate both dx and treatment poorly) .   For now will try just short course of levaquin and consider FOB/bal next  I had an extended discussion with the patient reviewing all relevant studies completed to date and  lasting 15 to 20 minutes of a 25 minute visit    Each maintenance medication was reviewed in detail including most importantly the difference between maintenance and prns and under what circumstances the prns are to be triggered using an action plan format that is not reflected in the computer generated alphabetically organized AVS.    Please see instructions for details which were reviewed in writing and the patient given a copy highlighting the part that I personally wrote and discussed at today's ov.

## 2016-03-09 ENCOUNTER — Telehealth: Payer: Self-pay | Admitting: *Deleted

## 2016-03-09 NOTE — Telephone Encounter (Signed)
That's fine but I would make sure if it happens again she notifiy her primary for eval and referral prn

## 2016-03-09 NOTE — Telephone Encounter (Signed)
Called and spoke with pt and she stated that she was not able to speak to leslie this am when she called due to the pt having company.  She stated that she feels that all of this going on now is from her clearing her throat all the time.  She stated that she will cont to monitor this and will call with any issues.

## 2016-03-09 NOTE — Telephone Encounter (Signed)
-----   Message from Tanda Rockers, MD sent at 03/08/2016  6:45 AM EDT ----- Needs refer to cards re throat pain with exertion and avoid over exerting in meantime - if starts getting worse or notes at rest the needs to go to [facility] ER as this could be angina equivalent

## 2016-03-09 NOTE — Telephone Encounter (Signed)
Spoke with the pt and notified of recs  She states not really having this issue anymore, but not really exerting much either  She asks if this cards eval can wait  She wants to just call if she has the problem again  Please advise thanks!

## 2016-03-19 ENCOUNTER — Telehealth: Payer: Self-pay | Admitting: Internal Medicine

## 2016-03-19 DIAGNOSIS — R05 Cough: Secondary | ICD-10-CM

## 2016-03-19 DIAGNOSIS — R058 Other specified cough: Secondary | ICD-10-CM

## 2016-03-19 NOTE — Telephone Encounter (Signed)
Spoke with pt, aware of voice center referral.  This has been placed.  Pt also wants to know- states that in last ov with MW, he mentioned "white flecks in lungs on CT".  Pt wants to know what the plan is to follow up on these "flecks" since the ABX has not helped.    MW please advise.  Thanks.

## 2016-03-19 NOTE — Telephone Encounter (Signed)
If the levaquin made no difference at all in her symptoms this is strong evidence against a lung infection of any kind causing the cough and in favor of irritable larynx syndrome so I think she should get an opinion on this from [facility] voice center/ Dr Joya Gaskins and we'll f/u the issue of the lung at her next appt as planned   Be sure takes all meds with her to see Dr Joya Gaskins Continue ppi  q am ac as instructed in meantime so he can see her with the ppi "on board"

## 2016-03-19 NOTE — Telephone Encounter (Signed)
Called and spoke with pt and she stated that she finished the abx 4 days ago and stated that there has been no change in her issues.  She stated that she starts to cough and clear her throat mid-day.  She stated that she was in a meeting today and this was all that she could do.  She takes the pantoprozole 40 mg in the morning, and she wonders if she should wait to take this later in the day.  She does not want to wait until her next app in January.  She said that if MW wants her to go to the voice center then she will.  MW please advise. thanks

## 2016-03-20 NOTE — Telephone Encounter (Signed)
Answered that question already:  Needs to keep f/u appt with me and we'll do a cxr then - we don't necessarily treat that problem - in fact 98% of time we just follow it as the treatment can be worse than the disease so we wait to offer more treatment until the dz is convincingly causing her symptoms  Since the levaquin didn't help at all that proved the point that the cough has nothing to do with the ct findings or flecks as she calls them

## 2016-03-20 NOTE — Telephone Encounter (Signed)
Called and spoke to pt. Informed her of the recs per MW. Pt verbalized understanding and denied any further questions or concerns at this time.   

## 2016-03-26 ENCOUNTER — Telehealth: Payer: Self-pay | Admitting: Internal Medicine

## 2016-03-26 NOTE — Telephone Encounter (Signed)
Called and spoke with pt and she stated that she has an appt with Dr. Carol Ada next Tuesday at [facility] voice center.  She requested that her tests be sent over to his practice. This has been done and nothing further is needed.

## 2016-03-31 DIAGNOSIS — R05 Cough: Secondary | ICD-10-CM | POA: Diagnosis not present

## 2016-03-31 DIAGNOSIS — R490 Dysphonia: Secondary | ICD-10-CM | POA: Diagnosis not present

## 2016-03-31 DIAGNOSIS — J383 Other diseases of vocal cords: Secondary | ICD-10-CM | POA: Diagnosis not present

## 2016-04-07 DIAGNOSIS — H2513 Age-related nuclear cataract, bilateral: Secondary | ICD-10-CM | POA: Diagnosis not present

## 2016-04-07 DIAGNOSIS — H25013 Cortical age-related cataract, bilateral: Secondary | ICD-10-CM | POA: Diagnosis not present

## 2016-04-07 DIAGNOSIS — H524 Presbyopia: Secondary | ICD-10-CM | POA: Diagnosis not present

## 2016-04-07 DIAGNOSIS — H04123 Dry eye syndrome of bilateral lacrimal glands: Secondary | ICD-10-CM | POA: Diagnosis not present

## 2016-04-22 DIAGNOSIS — J385 Laryngeal spasm: Secondary | ICD-10-CM | POA: Diagnosis not present

## 2016-04-22 DIAGNOSIS — J383 Other diseases of vocal cords: Secondary | ICD-10-CM | POA: Diagnosis not present

## 2016-04-22 DIAGNOSIS — R05 Cough: Secondary | ICD-10-CM | POA: Diagnosis not present

## 2016-04-22 DIAGNOSIS — R490 Dysphonia: Secondary | ICD-10-CM | POA: Diagnosis not present

## 2016-05-02 ENCOUNTER — Other Ambulatory Visit: Payer: Self-pay | Admitting: Internal Medicine

## 2016-05-05 ENCOUNTER — Other Ambulatory Visit: Payer: Self-pay

## 2016-05-05 MED ORDER — PANTOPRAZOLE SODIUM 40 MG PO TBEC: DELAYED_RELEASE_TABLET | ORAL | 2 refills | Status: DC

## 2016-05-05 MED ORDER — GABAPENTIN 300 MG PO CAPS: 300.0000 mg | ORAL_CAPSULE | Freq: Three times a day (TID) | ORAL | 2 refills | Status: DC

## 2016-05-12 DIAGNOSIS — H2513 Age-related nuclear cataract, bilateral: Secondary | ICD-10-CM | POA: Diagnosis not present

## 2016-05-12 DIAGNOSIS — H04123 Dry eye syndrome of bilateral lacrimal glands: Secondary | ICD-10-CM | POA: Diagnosis not present

## 2016-05-12 DIAGNOSIS — H01001 Unspecified blepharitis right upper eyelid: Secondary | ICD-10-CM | POA: Diagnosis not present

## 2016-05-12 DIAGNOSIS — H01004 Unspecified blepharitis left upper eyelid: Secondary | ICD-10-CM | POA: Diagnosis not present

## 2016-06-03 DIAGNOSIS — J45909 Unspecified asthma, uncomplicated: Secondary | ICD-10-CM | POA: Diagnosis not present

## 2016-06-03 DIAGNOSIS — Z79899 Other long term (current) drug therapy: Secondary | ICD-10-CM | POA: Diagnosis not present

## 2016-06-03 DIAGNOSIS — R05 Cough: Secondary | ICD-10-CM | POA: Diagnosis not present

## 2016-06-03 DIAGNOSIS — F458 Other somatoform disorders: Secondary | ICD-10-CM | POA: Diagnosis not present

## 2016-06-03 DIAGNOSIS — R490 Dysphonia: Secondary | ICD-10-CM | POA: Diagnosis not present

## 2016-06-03 DIAGNOSIS — K219 Gastro-esophageal reflux disease without esophagitis: Secondary | ICD-10-CM | POA: Diagnosis not present

## 2016-06-03 DIAGNOSIS — J383 Other diseases of vocal cords: Secondary | ICD-10-CM | POA: Diagnosis not present

## 2016-06-08 ENCOUNTER — Ambulatory Visit: Payer: Medicare Other | Admitting: Internal Medicine

## 2016-06-09 DIAGNOSIS — R05 Cough: Secondary | ICD-10-CM | POA: Diagnosis not present

## 2016-06-09 DIAGNOSIS — R490 Dysphonia: Secondary | ICD-10-CM | POA: Diagnosis not present

## 2016-06-09 DIAGNOSIS — J383 Other diseases of vocal cords: Secondary | ICD-10-CM | POA: Diagnosis not present

## 2016-06-09 DIAGNOSIS — J385 Laryngeal spasm: Secondary | ICD-10-CM | POA: Diagnosis not present

## 2016-07-14 ENCOUNTER — Other Ambulatory Visit: Payer: Self-pay | Admitting: Internal Medicine

## 2016-07-23 DIAGNOSIS — H2511 Age-related nuclear cataract, right eye: Secondary | ICD-10-CM | POA: Diagnosis not present

## 2016-07-23 DIAGNOSIS — H52221 Regular astigmatism, right eye: Secondary | ICD-10-CM | POA: Diagnosis not present

## 2016-07-23 DIAGNOSIS — H25011 Cortical age-related cataract, right eye: Secondary | ICD-10-CM | POA: Diagnosis not present

## 2016-07-23 DIAGNOSIS — H25811 Combined forms of age-related cataract, right eye: Secondary | ICD-10-CM | POA: Diagnosis not present

## 2016-08-10 ENCOUNTER — Other Ambulatory Visit: Payer: Self-pay | Admitting: Internal Medicine

## 2016-08-27 ENCOUNTER — Other Ambulatory Visit: Payer: Self-pay | Admitting: Internal Medicine

## 2016-08-27 DIAGNOSIS — Z1231 Encounter for screening mammogram for malignant neoplasm of breast: Secondary | ICD-10-CM

## 2016-09-03 DIAGNOSIS — H2512 Age-related nuclear cataract, left eye: Secondary | ICD-10-CM | POA: Diagnosis not present

## 2016-09-03 DIAGNOSIS — H52222 Regular astigmatism, left eye: Secondary | ICD-10-CM | POA: Diagnosis not present

## 2016-09-03 DIAGNOSIS — H25812 Combined forms of age-related cataract, left eye: Secondary | ICD-10-CM | POA: Diagnosis not present

## 2016-09-03 DIAGNOSIS — H25012 Cortical age-related cataract, left eye: Secondary | ICD-10-CM | POA: Diagnosis not present

## 2016-09-09 ENCOUNTER — Other Ambulatory Visit: Payer: Self-pay | Admitting: Internal Medicine

## 2016-10-13 ENCOUNTER — Ambulatory Visit
Admission: RE | Admit: 2016-10-13 | Discharge: 2016-10-13 | Disposition: A | Discharge status: Home / Self Care | Payer: Medicare Other | Source: Ambulatory Visit | Attending: Internal Medicine | Admitting: Internal Medicine

## 2016-10-13 DIAGNOSIS — Z1231 Encounter for screening mammogram for malignant neoplasm of breast: Secondary | ICD-10-CM

## 2016-10-14 ENCOUNTER — Other Ambulatory Visit: Payer: Self-pay | Admitting: Internal Medicine

## 2016-10-19 DIAGNOSIS — Z0000 Encounter for general adult medical examination without abnormal findings: Secondary | ICD-10-CM | POA: Diagnosis not present

## 2016-10-19 DIAGNOSIS — G4709 Other insomnia: Secondary | ICD-10-CM | POA: Diagnosis not present

## 2016-10-19 DIAGNOSIS — M859 Disorder of bone density and structure, unspecified: Secondary | ICD-10-CM | POA: Diagnosis not present

## 2016-10-19 DIAGNOSIS — R8299 Other abnormal findings in urine: Secondary | ICD-10-CM | POA: Diagnosis not present

## 2016-10-26 DIAGNOSIS — G4709 Other insomnia: Secondary | ICD-10-CM | POA: Diagnosis not present

## 2016-10-26 DIAGNOSIS — Z1389 Encounter for screening for other disorder: Secondary | ICD-10-CM | POA: Diagnosis not present

## 2016-10-26 DIAGNOSIS — M859 Disorder of bone density and structure, unspecified: Secondary | ICD-10-CM | POA: Diagnosis not present

## 2016-10-26 DIAGNOSIS — I8390 Asymptomatic varicose veins of unspecified lower extremity: Secondary | ICD-10-CM | POA: Diagnosis not present

## 2016-10-26 DIAGNOSIS — A318 Other mycobacterial infections: Secondary | ICD-10-CM | POA: Diagnosis not present

## 2016-10-26 DIAGNOSIS — R490 Dysphonia: Secondary | ICD-10-CM | POA: Diagnosis not present

## 2016-10-26 DIAGNOSIS — R1312 Dysphagia, oropharyngeal phase: Secondary | ICD-10-CM | POA: Diagnosis not present

## 2016-10-26 DIAGNOSIS — Z0000 Encounter for general adult medical examination without abnormal findings: Secondary | ICD-10-CM | POA: Diagnosis not present

## 2016-10-26 DIAGNOSIS — G43909 Migraine, unspecified, not intractable, without status migrainosus: Secondary | ICD-10-CM | POA: Diagnosis not present

## 2016-10-26 DIAGNOSIS — R05 Cough: Secondary | ICD-10-CM | POA: Diagnosis not present

## 2016-10-26 DIAGNOSIS — R0989 Other specified symptoms and signs involving the circulatory and respiratory systems: Secondary | ICD-10-CM | POA: Diagnosis not present

## 2016-10-26 DIAGNOSIS — Z6823 Body mass index (BMI) 23.0-23.9, adult: Secondary | ICD-10-CM | POA: Diagnosis not present

## 2016-10-27 DIAGNOSIS — M859 Disorder of bone density and structure, unspecified: Secondary | ICD-10-CM | POA: Diagnosis not present

## 2016-11-25 DIAGNOSIS — R490 Dysphonia: Secondary | ICD-10-CM | POA: Diagnosis not present

## 2016-11-25 DIAGNOSIS — R05 Cough: Secondary | ICD-10-CM | POA: Diagnosis not present

## 2016-11-25 DIAGNOSIS — Z7189 Other specified counseling: Secondary | ICD-10-CM | POA: Diagnosis not present

## 2017-02-02 DIAGNOSIS — I788 Other diseases of capillaries: Secondary | ICD-10-CM | POA: Diagnosis not present

## 2017-02-02 DIAGNOSIS — L570 Actinic keratosis: Secondary | ICD-10-CM | POA: Diagnosis not present

## 2017-02-02 DIAGNOSIS — L821 Other seborrheic keratosis: Secondary | ICD-10-CM | POA: Diagnosis not present

## 2017-02-02 DIAGNOSIS — D1801 Hemangioma of skin and subcutaneous tissue: Secondary | ICD-10-CM | POA: Diagnosis not present

## 2017-02-02 DIAGNOSIS — D2272 Melanocytic nevi of left lower limb, including hip: Secondary | ICD-10-CM | POA: Diagnosis not present

## 2017-02-16 DIAGNOSIS — Z23 Encounter for immunization: Secondary | ICD-10-CM | POA: Diagnosis not present

## 2017-02-24 DIAGNOSIS — R490 Dysphonia: Secondary | ICD-10-CM | POA: Diagnosis not present

## 2017-02-24 DIAGNOSIS — R05 Cough: Secondary | ICD-10-CM | POA: Diagnosis not present

## 2017-02-24 DIAGNOSIS — J383 Other diseases of vocal cords: Secondary | ICD-10-CM | POA: Diagnosis not present

## 2017-03-12 DIAGNOSIS — R05 Cough: Secondary | ICD-10-CM | POA: Diagnosis not present

## 2017-04-21 DIAGNOSIS — R05 Cough: Secondary | ICD-10-CM | POA: Diagnosis not present

## 2017-04-26 DIAGNOSIS — R05 Cough: Secondary | ICD-10-CM | POA: Diagnosis not present

## 2017-04-27 DIAGNOSIS — R05 Cough: Secondary | ICD-10-CM | POA: Diagnosis not present

## 2017-05-14 DIAGNOSIS — J383 Other diseases of vocal cords: Secondary | ICD-10-CM | POA: Diagnosis not present

## 2017-05-14 DIAGNOSIS — R05 Cough: Secondary | ICD-10-CM | POA: Diagnosis not present

## 2017-05-27 DIAGNOSIS — I700 Atherosclerosis of aorta: Secondary | ICD-10-CM | POA: Diagnosis not present

## 2017-05-27 DIAGNOSIS — R05 Cough: Secondary | ICD-10-CM | POA: Diagnosis not present

## 2017-05-27 DIAGNOSIS — R942 Abnormal results of pulmonary function studies: Secondary | ICD-10-CM | POA: Diagnosis not present

## 2017-06-02 DIAGNOSIS — R05 Cough: Secondary | ICD-10-CM | POA: Diagnosis not present

## 2017-09-10 ENCOUNTER — Other Ambulatory Visit: Payer: Self-pay | Admitting: Internal Medicine

## 2017-09-10 DIAGNOSIS — Z1231 Encounter for screening mammogram for malignant neoplasm of breast: Secondary | ICD-10-CM

## 2017-10-11 DIAGNOSIS — H0100A Unspecified blepharitis right eye, upper and lower eyelids: Secondary | ICD-10-CM | POA: Diagnosis not present

## 2017-10-11 DIAGNOSIS — H0100B Unspecified blepharitis left eye, upper and lower eyelids: Secondary | ICD-10-CM | POA: Diagnosis not present

## 2017-10-11 DIAGNOSIS — H5203 Hypermetropia, bilateral: Secondary | ICD-10-CM | POA: Diagnosis not present

## 2017-10-11 DIAGNOSIS — H532 Diplopia: Secondary | ICD-10-CM | POA: Diagnosis not present

## 2017-10-14 ENCOUNTER — Ambulatory Visit
Admission: RE | Admit: 2017-10-14 | Discharge: 2017-10-14 | Disposition: A | Discharge status: Home / Self Care | Payer: Medicare Other | Source: Ambulatory Visit | Attending: Internal Medicine | Admitting: Internal Medicine

## 2017-10-14 DIAGNOSIS — Z1231 Encounter for screening mammogram for malignant neoplasm of breast: Secondary | ICD-10-CM | POA: Diagnosis not present

## 2017-10-26 DIAGNOSIS — Z0000 Encounter for general adult medical examination without abnormal findings: Secondary | ICD-10-CM | POA: Diagnosis not present

## 2017-10-26 DIAGNOSIS — M859 Disorder of bone density and structure, unspecified: Secondary | ICD-10-CM | POA: Diagnosis not present

## 2017-10-26 DIAGNOSIS — R82998 Other abnormal findings in urine: Secondary | ICD-10-CM | POA: Diagnosis not present

## 2017-11-02 DIAGNOSIS — R05 Cough: Secondary | ICD-10-CM | POA: Diagnosis not present

## 2017-11-02 DIAGNOSIS — Z6822 Body mass index (BMI) 22.0-22.9, adult: Secondary | ICD-10-CM | POA: Diagnosis not present

## 2017-11-02 DIAGNOSIS — G4700 Insomnia, unspecified: Secondary | ICD-10-CM | POA: Diagnosis not present

## 2017-11-02 DIAGNOSIS — R490 Dysphonia: Secondary | ICD-10-CM | POA: Diagnosis not present

## 2017-11-02 DIAGNOSIS — A318 Other mycobacterial infections: Secondary | ICD-10-CM | POA: Diagnosis not present

## 2017-11-02 DIAGNOSIS — Z1212 Encounter for screening for malignant neoplasm of rectum: Secondary | ICD-10-CM | POA: Diagnosis not present

## 2017-11-02 DIAGNOSIS — G43909 Migraine, unspecified, not intractable, without status migrainosus: Secondary | ICD-10-CM | POA: Diagnosis not present

## 2017-11-02 DIAGNOSIS — M8580 Other specified disorders of bone density and structure, unspecified site: Secondary | ICD-10-CM | POA: Diagnosis not present

## 2017-11-02 DIAGNOSIS — Z0000 Encounter for general adult medical examination without abnormal findings: Secondary | ICD-10-CM | POA: Diagnosis not present

## 2017-11-02 DIAGNOSIS — Z1389 Encounter for screening for other disorder: Secondary | ICD-10-CM | POA: Diagnosis not present

## 2017-11-04 ENCOUNTER — Encounter: Payer: Self-pay | Admitting: Gastroenterology

## 2017-12-01 DIAGNOSIS — J471 Bronchiectasis with (acute) exacerbation: Secondary | ICD-10-CM | POA: Diagnosis not present

## 2017-12-01 DIAGNOSIS — R05 Cough: Secondary | ICD-10-CM | POA: Diagnosis not present

## 2017-12-02 DIAGNOSIS — R05 Cough: Secondary | ICD-10-CM | POA: Diagnosis not present

## 2017-12-16 DIAGNOSIS — R05 Cough: Secondary | ICD-10-CM | POA: Diagnosis not present

## 2017-12-16 DIAGNOSIS — J471 Bronchiectasis with (acute) exacerbation: Secondary | ICD-10-CM | POA: Diagnosis not present

## 2017-12-16 DIAGNOSIS — R911 Solitary pulmonary nodule: Secondary | ICD-10-CM | POA: Diagnosis not present

## 2017-12-16 DIAGNOSIS — A310 Pulmonary mycobacterial infection: Secondary | ICD-10-CM | POA: Diagnosis not present

## 2018-01-03 DIAGNOSIS — J470 Bronchiectasis with acute lower respiratory infection: Secondary | ICD-10-CM | POA: Diagnosis not present

## 2018-01-03 DIAGNOSIS — R05 Cough: Secondary | ICD-10-CM | POA: Diagnosis not present

## 2018-01-13 ENCOUNTER — Encounter: Payer: Self-pay | Admitting: Gastroenterology

## 2018-01-13 ENCOUNTER — Ambulatory Visit (INDEPENDENT_AMBULATORY_CARE_PROVIDER_SITE_OTHER): Payer: Medicare Other | Admitting: Gastroenterology

## 2018-01-13 ENCOUNTER — Encounter (INDEPENDENT_AMBULATORY_CARE_PROVIDER_SITE_OTHER): Payer: Self-pay

## 2018-01-13 VITALS — BP 96/60 | HR 76 | Ht 65.25 in | Wt 142.0 lb

## 2018-01-13 DIAGNOSIS — K648 Other hemorrhoids: Secondary | ICD-10-CM | POA: Diagnosis not present

## 2018-01-13 DIAGNOSIS — R195 Other fecal abnormalities: Secondary | ICD-10-CM | POA: Diagnosis not present

## 2018-01-13 MED ORDER — PEG-KCL-NACL-NASULF-NA ASC-C 140 G PO SOLR: 1.0000 | Freq: Once | ORAL | 0 refills | Status: AC

## 2018-01-13 NOTE — Progress Notes (Signed)
History of Present Illness: This is a 77 year old female referred by Marton Redwood, MD for the evaluation of a positive Hemosure test. Colonoscopy 05/2012 was normal.  She notes a painless "anal knot" for several week. Denies weight loss, abdominal pain, constipation, diarrhea, change in stool caliber, melena, hematochezia, nausea, vomiting, dysphagia, reflux symptoms, chest pain.    Allergies  Allergen Reactions  . Biaxin [Clarithromycin] Rash   Outpatient Medications Prior to Visit  Medication Sig Dispense Refill  . amLODipine (NORVASC) 5 MG tablet Take 5 mg by mouth daily.    Marland Kitchen atovaquone-proguanil (MALARONE) 250-100 MG TABS tablet TAKE 1 TABLET DAILY-BEGIN 2DAYS PRIOR TO TRAVEL & CONTINUE UNTIL 7 DAYS AFTER RETURN  0  . benzonatate (TESSALON) 100 MG capsule Take 1 capsule by mouth as needed.  0  . Calcium Carbonate-Vitamin D (CALCIUM PLUS VITAMIN D PO) Take 1 tablet by mouth 2 (two) times daily.     . fexofenadine (ALLEGRA) 180 MG tablet Take 1 tablet by mouth daily.    . Multiple Vitamin (MULTIVITAMIN) capsule Take 1 capsule by mouth daily.    Marland Kitchen omeprazole (PRILOSEC) 40 MG capsule Take 1 capsule by mouth 2 (two) times daily before a meal.  0  . sulfamethoxazole-trimethoprim (BACTRIM,SEPTRA) 400-80 MG tablet Take 1 tablet by mouth 2 (two) times daily.    . SUMAtriptan (IMITREX) 100 MG tablet Take 100 mg by mouth as needed for migraine. May repeat in 2 hours if headache persists or recurs.    . traMADol (ULTRAM) 50 MG tablet Take 1-2 tablets by mouth as needed.    . famotidine (PEPCID) 20 MG tablet Take 20 mg by mouth at bedtime.    . gabapentin (NEURONTIN) 300 MG capsule TAKE 1 CAPSULE (300 MG TOTAL) BY MOUTH 3 (THREE) TIMES DAILY. 90 capsule 0  . levofloxacin (LEVAQUIN) 500 MG tablet Take 1 tablet (500 mg total) by mouth daily. 10 tablet 0  . pantoprazole (PROTONIX) 40 MG tablet TAKE 1 TABLET BY MOUTH EVERY DAY 30-60 MINUTES BEFORE THE FIRST MEAL OF THE DAY 30 tablet 0   No  facility-administered medications prior to visit.    Past Medical History:  Diagnosis Date  . Chronic migraine   . GERD (gastroesophageal reflux disease)   . Insomnia   . Osteoporosis   . Pneumonia, organism [phone]  . Vertigo, benign positional    Past Surgical History:  Procedure Laterality Date  . CATARACT EXTRACTION, BILATERAL    . IRRIGATION AND DEBRIDEMENT SEBACEOUS CYST     on scalp/ 2 cysts  removed  . LAPAROSCOPIC APPENDECTOMY N/A 09/23/2012   Procedure: APPENDECTOMY LAPAROSCOPIC;  Surgeon: Zenovia Jarred, MD;  Location: [facility];  Service: General;  Laterality: N/A;  . TONSILLECTOMY AND ADENOIDECTOMY     Social History   Socioeconomic History  . Marital status: Married    Spouse name: Not on file  . Number of children: 2  . Years of education: Not on file  . Highest education level: Not on file  Occupational History  . Occupation: Retired Tour manager  . Financial resource strain: Not on file  . Food insecurity:    Worry: Not on file    Inability: Not on file  . Transportation needs:    Medical: Not on file    Non-medical: Not on file  Tobacco Use  . Smoking status: Never Smoker  . Smokeless tobacco: Never Used  Substance and Sexual Activity  . Alcohol use: Yes  Alcohol/week: 7.0 - 10.0 standard drinks    Types: 7 - 10 Glasses of wine per week    Comment: 1-2 glasses of wine daily  . Drug use: No  . Sexual activity: Not on file  Lifestyle  . Physical activity:    Days per week: Not on file    Minutes per session: Not on file  . Stress: Not on file  Relationships  . Social connections:    Talks on phone: Not on file    Gets together: Not on file    Attends religious service: Not on file    Active member of club or organization: Not on file    Attends meetings of clubs or organizations: Not on file    Relationship status: Not on file  Other Topics Concern  . Not on file  Social History Narrative  . Not on file    Family History  Problem Relation Age of Onset  . Breast cancer Mother 62  . Bladder Cancer Father 17  . Lung cancer Maternal Grandfather   . Alcoholism Son        Review of Systems: Pertinent positive and negative review of systems were noted in the above HPI section. All other review of systems were otherwise negative.  Current Medications, Allergies, Past Medical History, Past Surgical History, Family History and Social History were reviewed in [employer] record.  Physical Exam: General: Well developed, well nourished, no acute distress Head: Normocephalic and atraumatic Eyes:  sclerae anicteric, EOMI Ears: Normal auditory acuity Mouth: No deformity or lesions Neck: Supple, no masses or thyromegaly Lungs: Clear throughout to auscultation Heart: Regular rate and rhythm; no murmurs, rubs or bruits Abdomen: Soft, non tender and non distended. No masses, hepatosplenomegaly or hernias noted. Normal Bowel sounds Rectal: small hemorrhoid, small external skin tag, no tenderness, no other lesions, heme negative stool Musculoskeletal: Symmetrical with no gross deformities  Skin: No lesions on visible extremities Pulses:  Normal pulses noted Extremities: No clubbing, cyanosis, edema or deformities noted Neurological: Alert oriented x 4, grossly nonfocal Cervical Nodes:  No significant cervical adenopathy Inguinal Nodes: No significant inguinal adenopathy Psychological:  Alert and cooperative. Normal mood and affect  Assessment and Recommendations:  1.  Positive Hemosure test.  Rule out colorectal neoplasms, AVM, etc. Schedule colonoscopy. The risks (including bleeding, perforation, infection, missed lesions, medication reactions and possible hospitalization or surgery if complications occur), benefits, and alternatives to colonoscopy with possible biopsy and possible polypectomy were discussed with the patient and they consent to proceed.  Advised to consider  deferring annual Hemosure tests for 5 years following a complete, well prepped colonoscopy.  2. Internal hemorrhoid.  No active symptoms however she is aware of it.  Further evaluation with colonoscopy.  Begin Preparation H suppositories coated with 1% hydrocortisone cream at bedtime for 1 week then as needed.   cc: Marton Redwood, MD [address] [place], [place] [address]

## 2018-01-13 NOTE — Patient Instructions (Signed)
You can use preparation H suppositories coated in 1 % hydrocortisone cream daily x 1 week.  You have been scheduled for a colonoscopy. Please follow written instructions given to you at your visit today.  Please pick up your prep supplies at the pharmacy within the next 1-3 days. If you use inhalers (even only as needed), please bring them with you on the day of your procedure. Your physician has requested that you go to www.startemmi.com and enter the access code given to you at your visit today. This web site gives a general overview about your procedure. However, you should still follow specific instructions given to you by our office regarding your preparation for the procedure.  Thank you for choosing me and [facility].  Pricilla Riffle. Dagoberto Ligas., MD., Marval Regal

## 2018-02-07 DIAGNOSIS — R05 Cough: Secondary | ICD-10-CM | POA: Diagnosis not present

## 2018-02-07 DIAGNOSIS — J471 Bronchiectasis with (acute) exacerbation: Secondary | ICD-10-CM | POA: Diagnosis not present

## 2018-02-07 DIAGNOSIS — A310 Pulmonary mycobacterial infection: Secondary | ICD-10-CM | POA: Diagnosis not present

## 2018-02-12 DIAGNOSIS — Z23 Encounter for immunization: Secondary | ICD-10-CM | POA: Diagnosis not present

## 2018-02-21 DIAGNOSIS — R490 Dysphonia: Secondary | ICD-10-CM | POA: Diagnosis not present

## 2018-02-21 DIAGNOSIS — J383 Other diseases of vocal cords: Secondary | ICD-10-CM | POA: Diagnosis not present

## 2018-02-21 DIAGNOSIS — R05 Cough: Secondary | ICD-10-CM | POA: Diagnosis not present

## 2018-02-21 DIAGNOSIS — J471 Bronchiectasis with (acute) exacerbation: Secondary | ICD-10-CM | POA: Diagnosis not present

## 2018-03-01 ENCOUNTER — Ambulatory Visit (AMBULATORY_SURGERY_CENTER): Payer: Medicare Other | Admitting: Gastroenterology

## 2018-03-01 ENCOUNTER — Encounter: Payer: Self-pay | Admitting: Gastroenterology

## 2018-03-01 VITALS — BP 132/60 | HR 75 | Temp 97.5°F | Resp 11 | Ht 65.0 in | Wt 142.0 lb

## 2018-03-01 DIAGNOSIS — K921 Melena: Secondary | ICD-10-CM | POA: Diagnosis not present

## 2018-03-01 DIAGNOSIS — R195 Other fecal abnormalities: Secondary | ICD-10-CM | POA: Diagnosis not present

## 2018-03-01 DIAGNOSIS — Z1211 Encounter for screening for malignant neoplasm of colon: Secondary | ICD-10-CM | POA: Diagnosis not present

## 2018-03-01 MED ORDER — SODIUM CHLORIDE 0.9 % IV SOLN: 500.0000 mL | Freq: Once | INTRAVENOUS | Status: DC

## 2018-03-01 NOTE — Op Note (Signed)
[facility] Patient Name: Shelia Cunningham Procedure Date: 03/01/2018 1:40 PM MRN: [id number] Endoscopist: Ladene Artist , MD Age: 77 Referring MD:  Date of Birth: Dec 29, 1940 Gender: Female Account #: [id number] Procedure:                Colonoscopy Indications:              Positive fecal immunochemical test Medicines:                Monitored Anesthesia Care Procedure:                Pre-Anesthesia Assessment:                           - Prior to the procedure, a History and Physical                            was performed, and patient medications and                            allergies were reviewed. The patient's tolerance of                            previous anesthesia was also reviewed. The risks                            and benefits of the procedure and the sedation                            options and risks were discussed with the patient.                            All questions were answered, and informed consent                            was obtained. Prior Anticoagulants: The patient has                            taken no previous anticoagulant or antiplatelet                            agents. ASA Grade Assessment: II - A patient with                            mild systemic disease. After reviewing the risks                            and benefits, the patient was deemed in                            satisfactory condition to undergo the procedure.                           After obtaining informed consent, the colonoscope  was passed under direct vision. Throughout the                            procedure, the patient's blood pressure, pulse, and                            oxygen saturations were monitored continuously. The                            Model PCF-[id number]) scope was introduced                            through the anus and advanced to the the cecum,                            identified by  appendiceal orifice and ileocecal                            valve. The ileocecal valve, appendiceal orifice,                            and rectum were photographed. The quality of the                            bowel preparation was good. The patient tolerated                            the procedure well. The colonoscopy was somewhat                            difficult due to restricted mobility of the left                            colon. Successful completion of the procedure was                            aided by changing the patient's position,                            withdrawing and reinserting the scope,                            straightening and shortening the scope to obtain                            bowel loop reduction, using scope torsion and                            applying abdominal pressure. Scope In: 1:44:51 PM Scope Out: 2:06:56 PM Scope Withdrawal Time: 0 hours 10 minutes 19 seconds  Total Procedure Duration: 0 hours 22 minutes 5 seconds  Findings:                 The perianal and digital rectal examinations were  normal.                           The entire examined colon appeared normal on direct                            and retroflexion views. Complications:            No immediate complications. Estimated blood loss:                            None. Estimated Blood Loss:     Estimated blood loss: none. Impression:               - The entire examined colon is normal on direct and                            retroflexion views.                           - No specimens collected. Recommendation:           - Patient has a contact number available for                            emergencies. The signs and symptoms of potential                            delayed complications were discussed with the                            patient. Return to normal activities tomorrow.                            Written discharge instructions  were provided to the                            patient.                           - Resume previous diet.                           - Continue present medications.                           - No repeat colonoscopy due to age. Ladene Artist, MD 03/01/2018 2:11:23 PM This report has been signed electronically.

## 2018-03-01 NOTE — Patient Instructions (Signed)
NO REPEAT COLONOSCOPY DUE TO AGE  YOU HAD AN ENDOSCOPIC PROCEDURE TODAY AT THE [facility]:   Refer to the procedure report that was given to you for any specific questions about what was found during the examination.  If the procedure report does not answer your questions, please call your gastroenterologist to clarify.  If you requested that your care partner not be given the details of your procedure findings, then the procedure report has been included in a sealed envelope for you to review at your convenience later.  YOU SHOULD EXPECT: Some feelings of bloating in the abdomen. Passage of more gas than usual.  Walking can help get rid of the air that was put into your GI tract during the procedure and reduce the bloating. If you had a lower endoscopy (such as a colonoscopy or flexible sigmoidoscopy) you may notice spotting of blood in your stool or on the toilet paper. If you underwent a bowel prep for your procedure, you may not have a normal bowel movement for a few days.  Please Note:  You might notice some irritation and congestion in your nose or some drainage.  This is from the oxygen used during your procedure.  There is no need for concern and it should clear up in a day or so.  SYMPTOMS TO REPORT IMMEDIATELY:   Following lower endoscopy (colonoscopy or flexible sigmoidoscopy):  Excessive amounts of blood in the stool  Significant tenderness or worsening of abdominal pains  Swelling of the abdomen that is new, acute  Fever of 100F or higher   For urgent or emergent issues, a gastroenterologist can be reached at any hour by calling [phone].   DIET:  We do recommend a small meal at first, but then you may proceed to your regular diet.  Drink plenty of fluids but you should avoid alcoholic beverages for 24 hours.  ACTIVITY:  You should plan to take it easy for the rest of today and you should NOT DRIVE or use heavy machinery until tomorrow (because of the sedation  medicines used during the test).    FOLLOW UP: Our staff will call the number listed on your records the next business day following your procedure to check on you and address any questions or concerns that you may have regarding the information given to you following your procedure. If we do not reach you, we will leave a message.  However, if you are feeling well and you are not experiencing any problems, there is no need to return our call.  We will assume that you have returned to your regular daily activities without incident.  If any biopsies were taken you will be contacted by phone or by letter within the next 1-3 weeks.  Please call us at [phone] if you have not heard about the biopsies in 3 weeks.    SIGNATURES/CONFIDENTIALITY: You and/or your care partner have signed paperwork which will be entered into your electronic medical record.  These signatures attest to the fact that that the information above on your After Visit Summary has been reviewed and is understood.  Full responsibility of the confidentiality of this discharge information lies with you and/or your care-partner.

## 2018-03-01 NOTE — Progress Notes (Signed)
Pt's states no medical or surgical changes since previsit or office visit. 

## 2018-03-01 NOTE — Progress Notes (Signed)
To PACU, VSS. Report to [email or web address] 

## 2018-03-01 NOTE — Progress Notes (Signed)
Patient stated when her appendectomy was performed 2014 that it was wrapped around colon.

## 2018-03-02 ENCOUNTER — Telehealth: Payer: Self-pay

## 2018-03-02 ENCOUNTER — Telehealth: Payer: Self-pay | Admitting: *Deleted

## 2018-03-02 NOTE — Telephone Encounter (Signed)
  Follow up Call-  Call back number 03/01/2018  Post procedure Call Back phone  # [phone]  Permission to leave phone message Yes  Some recent data might be hidden     Patient questions:  Do you have a fever, pain , or abdominal swelling? No. Pain Score  0 *  Have you tolerated food without any problems? Yes.    Have you been able to return to your normal activities? Yes.    Do you have any questions about your discharge instructions: Diet   No. Medications  No. Follow up visit  No.  Do you have questions or concerns about your Care? No.  Actions: * If pain score is 4 or above: No action needed, pain <4.

## 2018-03-02 NOTE — Telephone Encounter (Signed)
Left message

## 2018-03-08 DIAGNOSIS — L603 Nail dystrophy: Secondary | ICD-10-CM | POA: Diagnosis not present

## 2018-03-08 DIAGNOSIS — D2272 Melanocytic nevi of left lower limb, including hip: Secondary | ICD-10-CM | POA: Diagnosis not present

## 2018-03-08 DIAGNOSIS — L821 Other seborrheic keratosis: Secondary | ICD-10-CM | POA: Diagnosis not present

## 2018-03-08 DIAGNOSIS — D1801 Hemangioma of skin and subcutaneous tissue: Secondary | ICD-10-CM | POA: Diagnosis not present

## 2018-03-08 DIAGNOSIS — L738 Other specified follicular disorders: Secondary | ICD-10-CM | POA: Diagnosis not present

## 2018-03-08 DIAGNOSIS — D2239 Melanocytic nevi of other parts of face: Secondary | ICD-10-CM | POA: Diagnosis not present

## 2018-05-24 DIAGNOSIS — J470 Bronchiectasis with acute lower respiratory infection: Secondary | ICD-10-CM | POA: Diagnosis not present

## 2018-05-24 DIAGNOSIS — J383 Other diseases of vocal cords: Secondary | ICD-10-CM | POA: Diagnosis not present

## 2018-05-24 DIAGNOSIS — R05 Cough: Secondary | ICD-10-CM | POA: Diagnosis not present

## 2018-08-25 DIAGNOSIS — J470 Bronchiectasis with acute lower respiratory infection: Secondary | ICD-10-CM | POA: Diagnosis not present

## 2018-09-08 ENCOUNTER — Other Ambulatory Visit: Payer: Self-pay | Admitting: Internal Medicine

## 2018-09-08 DIAGNOSIS — Z1231 Encounter for screening mammogram for malignant neoplasm of breast: Secondary | ICD-10-CM

## 2018-10-31 DIAGNOSIS — R7989 Other specified abnormal findings of blood chemistry: Secondary | ICD-10-CM | POA: Diagnosis not present

## 2018-10-31 DIAGNOSIS — R195 Other fecal abnormalities: Secondary | ICD-10-CM | POA: Diagnosis not present

## 2018-10-31 DIAGNOSIS — R82998 Other abnormal findings in urine: Secondary | ICD-10-CM | POA: Diagnosis not present

## 2018-10-31 DIAGNOSIS — M859 Disorder of bone density and structure, unspecified: Secondary | ICD-10-CM | POA: Diagnosis not present

## 2018-11-07 DIAGNOSIS — G43909 Migraine, unspecified, not intractable, without status migrainosus: Secondary | ICD-10-CM | POA: Diagnosis not present

## 2018-11-07 DIAGNOSIS — I8390 Asymptomatic varicose veins of unspecified lower extremity: Secondary | ICD-10-CM | POA: Diagnosis not present

## 2018-11-07 DIAGNOSIS — F329 Major depressive disorder, single episode, unspecified: Secondary | ICD-10-CM | POA: Diagnosis not present

## 2018-11-07 DIAGNOSIS — Z1331 Encounter for screening for depression: Secondary | ICD-10-CM | POA: Diagnosis not present

## 2018-11-07 DIAGNOSIS — M8580 Other specified disorders of bone density and structure, unspecified site: Secondary | ICD-10-CM | POA: Diagnosis not present

## 2018-11-07 DIAGNOSIS — J479 Bronchiectasis, uncomplicated: Secondary | ICD-10-CM | POA: Diagnosis not present

## 2018-11-07 DIAGNOSIS — D649 Anemia, unspecified: Secondary | ICD-10-CM | POA: Diagnosis not present

## 2018-11-07 DIAGNOSIS — A318 Other mycobacterial infections: Secondary | ICD-10-CM | POA: Diagnosis not present

## 2018-11-07 DIAGNOSIS — D72819 Decreased white blood cell count, unspecified: Secondary | ICD-10-CM | POA: Diagnosis not present

## 2018-11-07 DIAGNOSIS — Z0000 Encounter for general adult medical examination without abnormal findings: Secondary | ICD-10-CM | POA: Diagnosis not present

## 2018-11-15 DIAGNOSIS — Z23 Encounter for immunization: Secondary | ICD-10-CM | POA: Diagnosis not present

## 2018-11-22 DIAGNOSIS — J470 Bronchiectasis with acute lower respiratory infection: Secondary | ICD-10-CM | POA: Diagnosis not present

## 2018-11-23 DIAGNOSIS — Z961 Presence of intraocular lens: Secondary | ICD-10-CM | POA: Diagnosis not present

## 2018-11-23 DIAGNOSIS — H43813 Vitreous degeneration, bilateral: Secondary | ICD-10-CM | POA: Diagnosis not present

## 2018-11-23 DIAGNOSIS — H524 Presbyopia: Secondary | ICD-10-CM | POA: Diagnosis not present

## 2018-11-23 DIAGNOSIS — H04123 Dry eye syndrome of bilateral lacrimal glands: Secondary | ICD-10-CM | POA: Diagnosis not present

## 2019-01-21 DIAGNOSIS — Z23 Encounter for immunization: Secondary | ICD-10-CM | POA: Diagnosis not present

## 2019-01-25 ENCOUNTER — Other Ambulatory Visit: Payer: Self-pay

## 2019-01-25 ENCOUNTER — Ambulatory Visit
Admission: RE | Admit: 2019-01-25 | Discharge: 2019-01-25 | Disposition: A | Discharge status: Home / Self Care | Payer: Medicare Other | Source: Ambulatory Visit | Attending: Internal Medicine | Admitting: Internal Medicine

## 2019-01-25 DIAGNOSIS — Z1231 Encounter for screening mammogram for malignant neoplasm of breast: Secondary | ICD-10-CM

## 2019-02-07 DIAGNOSIS — D649 Anemia, unspecified: Secondary | ICD-10-CM | POA: Diagnosis not present

## 2019-04-17 DIAGNOSIS — L282 Other prurigo: Secondary | ICD-10-CM | POA: Diagnosis not present

## 2019-04-17 DIAGNOSIS — L309 Dermatitis, unspecified: Secondary | ICD-10-CM | POA: Diagnosis not present

## 2019-06-05 DIAGNOSIS — L570 Actinic keratosis: Secondary | ICD-10-CM | POA: Diagnosis not present

## 2019-06-05 DIAGNOSIS — L309 Dermatitis, unspecified: Secondary | ICD-10-CM | POA: Diagnosis not present

## 2019-06-05 DIAGNOSIS — L738 Other specified follicular disorders: Secondary | ICD-10-CM | POA: Diagnosis not present

## 2019-06-05 DIAGNOSIS — C44729 Squamous cell carcinoma of skin of left lower limb, including hip: Secondary | ICD-10-CM | POA: Diagnosis not present

## 2019-06-05 DIAGNOSIS — D1801 Hemangioma of skin and subcutaneous tissue: Secondary | ICD-10-CM | POA: Diagnosis not present

## 2019-06-05 DIAGNOSIS — C44722 Squamous cell carcinoma of skin of right lower limb, including hip: Secondary | ICD-10-CM | POA: Diagnosis not present

## 2019-06-05 DIAGNOSIS — L821 Other seborrheic keratosis: Secondary | ICD-10-CM | POA: Diagnosis not present

## 2019-06-05 DIAGNOSIS — D485 Neoplasm of uncertain behavior of skin: Secondary | ICD-10-CM | POA: Diagnosis not present

## 2019-11-15 DIAGNOSIS — R7989 Other specified abnormal findings of blood chemistry: Secondary | ICD-10-CM | POA: Diagnosis not present

## 2019-11-15 DIAGNOSIS — M859 Disorder of bone density and structure, unspecified: Secondary | ICD-10-CM | POA: Diagnosis not present

## 2019-11-15 DIAGNOSIS — D649 Anemia, unspecified: Secondary | ICD-10-CM | POA: Diagnosis not present

## 2019-11-22 DIAGNOSIS — Z1331 Encounter for screening for depression: Secondary | ICD-10-CM | POA: Diagnosis not present

## 2019-11-22 DIAGNOSIS — A318 Other mycobacterial infections: Secondary | ICD-10-CM | POA: Diagnosis not present

## 2019-11-22 DIAGNOSIS — M542 Cervicalgia: Secondary | ICD-10-CM | POA: Diagnosis not present

## 2019-11-22 DIAGNOSIS — I8390 Asymptomatic varicose veins of unspecified lower extremity: Secondary | ICD-10-CM | POA: Diagnosis not present

## 2019-11-22 DIAGNOSIS — J479 Bronchiectasis, uncomplicated: Secondary | ICD-10-CM | POA: Diagnosis not present

## 2019-11-22 DIAGNOSIS — R82998 Other abnormal findings in urine: Secondary | ICD-10-CM | POA: Diagnosis not present

## 2019-11-22 DIAGNOSIS — M8580 Other specified disorders of bone density and structure, unspecified site: Secondary | ICD-10-CM | POA: Diagnosis not present

## 2019-11-22 DIAGNOSIS — F325 Major depressive disorder, single episode, in full remission: Secondary | ICD-10-CM | POA: Diagnosis not present

## 2019-11-22 DIAGNOSIS — Z0000 Encounter for general adult medical examination without abnormal findings: Secondary | ICD-10-CM | POA: Diagnosis not present

## 2019-11-22 DIAGNOSIS — G43909 Migraine, unspecified, not intractable, without status migrainosus: Secondary | ICD-10-CM | POA: Diagnosis not present

## 2019-11-22 DIAGNOSIS — R05 Cough: Secondary | ICD-10-CM | POA: Diagnosis not present

## 2019-11-22 DIAGNOSIS — H8110 Benign paroxysmal vertigo, unspecified ear: Secondary | ICD-10-CM | POA: Diagnosis not present

## 2019-11-22 DIAGNOSIS — R490 Dysphonia: Secondary | ICD-10-CM | POA: Diagnosis not present

## 2019-11-23 DIAGNOSIS — J470 Bronchiectasis with acute lower respiratory infection: Secondary | ICD-10-CM | POA: Diagnosis not present

## 2019-11-23 DIAGNOSIS — R05 Cough: Secondary | ICD-10-CM | POA: Diagnosis not present

## 2019-11-23 DIAGNOSIS — J383 Other diseases of vocal cords: Secondary | ICD-10-CM | POA: Diagnosis not present

## 2019-12-25 ENCOUNTER — Other Ambulatory Visit: Payer: Self-pay | Admitting: Internal Medicine

## 2019-12-25 DIAGNOSIS — Z1231 Encounter for screening mammogram for malignant neoplasm of breast: Secondary | ICD-10-CM

## 2020-01-17 DIAGNOSIS — R197 Diarrhea, unspecified: Secondary | ICD-10-CM | POA: Diagnosis not present

## 2020-01-17 DIAGNOSIS — H8110 Benign paroxysmal vertigo, unspecified ear: Secondary | ICD-10-CM | POA: Diagnosis not present

## 2020-01-29 ENCOUNTER — Other Ambulatory Visit: Payer: Self-pay

## 2020-01-29 ENCOUNTER — Ambulatory Visit: Payer: Medicare Other | Attending: Internal Medicine | Admitting: Physical Therapy

## 2020-01-29 ENCOUNTER — Encounter: Payer: Self-pay | Admitting: Physical Therapy

## 2020-01-29 DIAGNOSIS — H8111 Benign paroxysmal vertigo, right ear: Secondary | ICD-10-CM | POA: Diagnosis not present

## 2020-01-29 NOTE — Patient Instructions (Signed)
Pt was given article from [facility] [place] on etiology of BPPV; pt already had Brandt-Daroff exercises and reports she has been doing these exs. At home

## 2020-01-29 NOTE — Therapy (Signed)
[facility] [address] [place], [place], [address] Phone: [phone]   Fax:  [phone]  Physical Therapy Evaluation  Patient Details  Name: Shelia Cunningham MRN: [id number] Date of Birth: 09-23-1940 Referring Provider (PT): Dr. Marton Redwood   Encounter Date: 01/29/2020   PT End of Session - 01/29/20 1147    Visit Number 1    Number of Visits 4    Authorization Type Medicare    Authorization Time Period 01-29-20 - 03-30-20    PT Start Time 0846    PT Stop Time 0930    PT Time Calculation (min) 44 min    Activity Tolerance Patient tolerated treatment well    Behavior During Therapy [facility] for tasks assessed/performed           Past Medical History:  Diagnosis Date  . Chronic migraine   . GERD (gastroesophageal reflux disease)   . Insomnia   . Osteoporosis   . Pneumonia, organism unspecified(486) 12-25-11  . Vertigo, benign positional     Past Surgical History:  Procedure Laterality Date  . CATARACT EXTRACTION, BILATERAL    . IRRIGATION AND DEBRIDEMENT SEBACEOUS CYST     on scalp/ 2 cysts  removed  . LAPAROSCOPIC APPENDECTOMY N/A 09/23/2012   Procedure: APPENDECTOMY LAPAROSCOPIC;  Surgeon: Zenovia Jarred, MD;  Location: [facility];  Service: General;  Laterality: N/A;  . TONSILLECTOMY AND ADENOIDECTOMY      There were no vitals filed for this visit.    Subjective Assessment - 01/29/20 0848    Subjective Pt states she had a severe episode of dizziness when she was painting a door on 01-14-20; states dizziness lasted for 4 days - she had nausea (but no vomiting) - went to bed because she didn't feel well; pt states initial episode started months ago.  Pt went to MD on Wed., 01-17-20 - states he checked for orthostatic hypotension but readings remained nearly same. He diagnosed BPPV, right ear.  Pt states the vertigo depends on the movment.    Pertinent History h/o migraines    Patient Stated Goals Resolve the  vertigo    Currently in Pain? No/denies              [facility] PT Assessment - 01/29/20 0854      Assessment   Medical Diagnosis BPPV    Referring Provider (PT) Dr. Marton Redwood    Onset Date/Surgical Date 01/14/20    Prior Therapy none for BPPV      Precautions   Precautions None      Restrictions   Weight Bearing Restrictions No      Balance Screen   Has the patient fallen in the past 6 months No    Has the patient had a decrease in activity level because of a fear of falling?  No    Is the patient reluctant to leave their home because of a fear of falling?  No      Prior Function   Level of Independence Independent      Observation/Other Assessments   Focus on Therapeutic Outcomes (FOTO)  pt's primary FS measure 55/100 with risk adjusted 68/100    Other Surveys  Dizziness Handicap Inventory (DHI)    Dizziness Handicap Inventory (DHI)  28% (mild handicap group)      Ambulation/Gait   Ambulation/Gait Yes    Ambulation/Gait Assistance 7: Independent    Ambulation Distance (Feet) 100 Feet    Assistive device None    Gait  Pattern Within Functional Limits    Ambulation Surface Level;Indoor                  Vestibular Assessment - 01/29/20 0001      Vestibular Assessment   General Observation pt is an 79 yr old lady with c/o vertigo that had been occurring intermittently for past several months; pt states she had severe onset dizziness and nausea on 01-14-20;  states she feels it when she is reclined back in chair at dentist's office      Symptom Behavior   Subjective history of current problem Pt reports she became very dizzy after painting a door at home on 01-14-20; pt states she became nauseated and went to bed; says episode lasted about 4 days; Pt did Epley at home on 01-18-20 but it made her so dizzy so she stopped the exercise     Type of Dizziness  Lightheadedness;Vertigo;Comment   has nausea and headache   Frequency of Dizziness varies depending on the movement     Duration of Dizziness minutes to hours - wonders if the vertigo triggers a migraine    Symptom Nature Positional    Aggravating Factors Looking up to the ceiling;Rolling to right;Comment   also raising UE's (working overhead)   Relieving Factors Lying supine    Progression of Symptoms Better    History of similar episodes pt had intermittent dizziness for several months prior to early Sept.       Positional Testing   Dix-Hallpike Dix-Hallpike Right      Dix-Hallpike Right   Dix-Hallpike Right Duration approx. 10 secs with delayed onset of approx. 8 secs    Dix-Hallpike Right Symptoms Upbeat, right rotatory nystagmus              Objective measurements completed on examination: See above findings.               PT Education - 01/29/20 1146    Education Details article on etiology of BPPV from [facility]; pt already had Brandt-Daroff exercises    Person(s) Educated Patient    Methods Explanation;Demonstration;Handout    Comprehension Verbalized understanding;Returned demonstration               PT Long Term Goals - 01/29/20 1156      PT LONG TERM GOAL #1   Title Pt will have a (-) Rt Dix-Hallpike test with no nystagmus and no c/o vertigo to indicate resolution of Rt BPPV.    Time 4    Period Weeks    Status New    Target Date 03/01/20      PT LONG TERM GOAL #2   Title Pt will improve DHI score from 28% to </= 18% to demo improvement in vertigo.    Baseline 28% (Mild)    Time 4    Period Weeks    Status New    Target Date 03/01/20      PT LONG TERM GOAL #3   Title Independent in HEP for habituation exercises for self-treatment of BPPV prn.    Time 4    Period Weeks    Status New    Target Date 03/01/20                  Plan - 01/29/20 1148    Clinical Impression Statement Pt presents with (+) Rt Dix-Hallpike test with Rt rotary upbeating nystagmus, indicative of Rt posterior canalithiasis.  Epley maneuver was performed 2 reps and symptoms  appeared to  be mostly resolved on 2nd rep, although pt reported feeling nauseous and "woozy".  Pt will benefit from skilled PT to address vertigo.    Personal Factors and Comorbidities Past/Current Experience;Comorbidity 1    Comorbidities h/o migraines    Examination-Activity Limitations Locomotion Level;Reach Overhead;Bend;Bed Mobility    Examination-Participation Restrictions Meal Prep;Cleaning;Laundry;Other   home management tasks   Stability/Clinical Decision Making Stable/Uncomplicated    Clinical Decision Making Low    Rehab Potential Good    PT Frequency 1x / week    PT Duration 4 weeks    PT Next Visit Plan recheck Rt Dix-Hallpike    PT Home Exercise Plan Brandt-Daroff (pt already had these exercises)    Consulted and Agree with Plan of Care Patient           Patient will benefit from skilled therapeutic intervention in order to improve the following deficits and impairments:  Dizziness  Visit Diagnosis: BPPV (benign paroxysmal positional vertigo), right - Plan: PT plan of care cert/re-cert     Problem List Patient Active Problem List   Diagnosis Date Noted  . Angina pectoris syndrome (HCC)  equivalent = thorat pain with exertion  03/08/2016  . 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 S/P laparoscopic appendectomy 10/26/2012  . Acute appendicitis with perforation and peritoneal abscess 09/23/2012  . Pneumonia 02/23/2012  . Upper airway cough syndrome 02/11/2012    Alda Lea, PT 01/29/2020, 12:07 PM  [facility] [address] [place], [place], [address] Phone: [phone]   Fax:  [phone]  Name: SURAIYA DICKERSON MRN: [id number] Date of Birth: [birth year]

## 2020-02-06 ENCOUNTER — Ambulatory Visit: Payer: Medicare Other | Admitting: Physical Therapy

## 2020-02-06 ENCOUNTER — Other Ambulatory Visit: Payer: Self-pay

## 2020-02-06 DIAGNOSIS — H8111 Benign paroxysmal vertigo, right ear: Secondary | ICD-10-CM

## 2020-02-07 ENCOUNTER — Encounter: Payer: Self-pay | Admitting: Physical Therapy

## 2020-02-07 NOTE — Therapy (Signed)
[facility] [address] [place], [place], [address] Phone: [phone]   Fax:  [phone]  Physical Therapy Treatment & Discharge Summary  Patient Details  Name: Shelia Cunningham MRN: [id number] Date of Birth: Feb 16, 1941 Referring Provider (PT): Dr. Marton Redwood   Encounter Date: 02/06/2020   PT End of Session - 02/07/20 2055    Visit Number 2    Number of Visits 4    Authorization Type Medicare    Authorization Time Period 01-29-20 - 03-30-20    PT Start Time 1532    PT Stop Time 1556    PT Time Calculation (min) 24 min    Activity Tolerance Patient tolerated treatment well    Behavior During Therapy [facility] for tasks assessed/performed           Past Medical History:  Diagnosis Date  . Chronic migraine   . GERD (gastroesophageal reflux disease)   . Insomnia   . Osteoporosis   . Pneumonia, organism unspecified(486) 12-25-11  . Vertigo, benign positional     Past Surgical History:  Procedure Laterality Date  . CATARACT EXTRACTION, BILATERAL    . IRRIGATION AND DEBRIDEMENT SEBACEOUS CYST     on scalp/ 2 cysts  removed  . LAPAROSCOPIC APPENDECTOMY N/A 09/23/2012   Procedure: APPENDECTOMY LAPAROSCOPIC;  Surgeon: Zenovia Jarred, MD;  Location: [facility];  Service: General;  Laterality: N/A;  . TONSILLECTOMY AND ADENOIDECTOMY      There were no vitals filed for this visit.   Subjective Assessment - 02/06/20 1523    Subjective Pt states when she does too much, such as housework or other activities it brings on light-headedness and nausea - so she stops and rests; states the vertigo has resolved - reports she came today because she wants to be sure it is really resolved    Patient Stated Goals Resolve the vertigo    Currently in Pain? No/denies                   Vestibular Assessment - 02/07/20 0001      Positional Testing   Dix-Hallpike Dix-Hallpike Right    Sidelying Test Sidelying Right;Sidelying  Left      Dix-Hallpike Right   Dix-Hallpike Right Duration none    Dix-Hallpike Right Symptoms No nystagmus      Sidelying Right   Sidelying Right Duration none    Sidelying Right Symptoms No nystagmus      Sidelying Left   Sidelying Left Duration none    Sidelying Left Symptoms No nystagmus                            PT Education - 02/07/20 2055    Education Details discussed LTG's and progress    Person(s) Educated Patient    Methods Explanation    Comprehension Verbalized understanding               PT Long Term Goals - 02/07/20 2057      PT LONG TERM GOAL #1   Title Pt will have a (-) Rt Dix-Hallpike test with no nystagmus and no c/o vertigo to indicate resolution of Rt BPPV.    Baseline met 02-06-20    Time 4    Period Weeks    Status Achieved      PT LONG TERM GOAL #2   Title Pt will improve DHI score from 28% to </= 18% to demo improvement in vertigo.  Baseline 28% (Mild); 4% on 02-06-20    Time 4    Period Weeks    Status Achieved      PT LONG TERM GOAL #3   Title Independent in HEP for habituation exercises for self-treatment of BPPV prn.    Time 4    Period Weeks    Status Achieved                 Plan - 02/07/20 2056    Clinical Impression Statement Pt has met all LTG's; Rt BPPV has resolved as of this time.    Personal Factors and Comorbidities Past/Current Experience;Comorbidity 1    Comorbidities h/o migraines    Examination-Activity Limitations Locomotion Level;Reach Overhead;Bend;Bed Mobility    Examination-Participation Restrictions Meal Prep;Cleaning;Laundry;Other   home management tasks   Stability/Clinical Decision Making Stable/Uncomplicated    Rehab Potential Good    PT Frequency 1x / week    PT Duration 4 weeks    PT Next Visit Plan N/A - pt is D/C'd    PT Home Exercise Plan Brandt-Daroff (pt already had these exercises)    Consulted and Agree with Plan of Care Patient           Patient will benefit  from skilled therapeutic intervention in order to improve the following deficits and impairments:  Dizziness  Visit Diagnosis: BPPV (benign paroxysmal positional vertigo), right     Problem List Patient Active Problem List   Diagnosis Date Noted  . Angina pectoris syndrome (HCC)  equivalent = thorat pain with exertion  03/08/2016  . 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 S/P laparoscopic appendectomy 10/26/2012  . Acute appendicitis with perforation and peritoneal abscess 09/23/2012  . Pneumonia 02/23/2012  . Upper airway cough syndrome 02/11/2012    PHYSICAL THERAPY DISCHARGE SUMMARY  Visits from Start of Care:  2  Current functional level related to goals / functional outcomes: See above for progress towards goals - BPPV has resolved at this time   Remaining deficits: None regarding vertigo   Education / Equipment: Pt has been instructed in Epley maneuver and habituation exs. for self treatment prn  Plan: Patient agrees to discharge.  Patient goals were met. Patient is being discharged due to meeting the stated rehab goals.  ?????       Alda Lea, PT 02/07/2020, 8:58 PM  [facility] [address] [place], [place], [address] Phone: [phone]   Fax:  [phone]  Name: Shelia Cunningham MRN: [id number] Date of Birth: 1941-03-13

## 2020-02-10 DIAGNOSIS — Z23 Encounter for immunization: Secondary | ICD-10-CM | POA: Diagnosis not present

## 2020-02-14 ENCOUNTER — Ambulatory Visit (INDEPENDENT_AMBULATORY_CARE_PROVIDER_SITE_OTHER): Payer: Medicare Other | Admitting: Vascular Surgery

## 2020-02-14 ENCOUNTER — Encounter: Payer: Self-pay | Admitting: Vascular Surgery

## 2020-02-14 ENCOUNTER — Other Ambulatory Visit: Payer: Self-pay

## 2020-02-14 VITALS — Resp 16 | Ht 65.0 in | Wt 138.0 lb

## 2020-02-14 DIAGNOSIS — I83813 Varicose veins of bilateral lower extremities with pain: Secondary | ICD-10-CM

## 2020-02-14 NOTE — Progress Notes (Signed)
Referring Physician: Dr. Brigitte Pulse  Patient name: Shelia Cunningham MRN: [id number] DOB: 05-02-1941 Sex: female  REASON FOR CONSULT: Symptomatic varicose veins with pain right greater than left leg  HPI: Shelia Cunningham is a 79 y.o. female, who has a several month history of increasing pain especially behind her right knee over a cluster of varicose veins.  The symptoms also become warm and inflamed.  She has a similar cluster behind her left leg but not as prominent.  Her legs become heavy full and achy as the day progresses.  She has intermittently worn some pain a hose type of support stockings which help a little bit.  She has worn knee-high compression stockings in the remote past.  She did have sclerotherapy on the lateral aspect of her left knee about 18 to 20 years ago.  She has had some recurrence of varicosities in this area since then.  She has no prior history of DVT.  She does have a family history of varicose veins in her mother.   Past Medical History:  Diagnosis Date  . Chronic migraine   . GERD (gastroesophageal reflux disease)   . Insomnia   . Osteoporosis   . Pneumonia, organism unspecified(486) 12-25-11  . Vertigo, benign positional    Past Surgical History:  Procedure Laterality Date  . CATARACT EXTRACTION, BILATERAL    . IRRIGATION AND DEBRIDEMENT SEBACEOUS CYST     on scalp/ 2 cysts  removed  . LAPAROSCOPIC APPENDECTOMY N/A 09/23/2012   Procedure: APPENDECTOMY LAPAROSCOPIC;  Surgeon: Zenovia Jarred, MD;  Location: [facility] OR;  Service: General;  Laterality: N/A;  . TONSILLECTOMY AND ADENOIDECTOMY      Family History  Problem Relation Age of Onset  . Breast cancer Mother 82  . Bladder Cancer Father 79  . Lung cancer Maternal Grandfather   . Alcoholism Son     SOCIAL HISTORY: Social History   Socioeconomic History  . Marital status: Married    Spouse name: Not on file  . Number of children: 2  . Years of education: Not on file  . Highest education level: Not  on file  Occupational History  . Occupation: Retired Pharmacist, hospital  Tobacco Use  . Smoking status: Never Smoker  . Smokeless tobacco: Never Used  Substance and Sexual Activity  . Alcohol use: Yes    Alcohol/week: 7.0 - 10.0 standard drinks    Types: 7 - 10 Glasses of wine per week    Comment: 1-2 glasses of wine daily  . Drug use: No  . Sexual activity: Not on file  Other Topics Concern  . Not on file  Social History Narrative  . Not on file   Social Determinants of Health   Financial Resource Strain:   . Difficulty of Paying Living Expenses: Not on file  Food Insecurity:   . Worried About Charity fundraiser in the Last Year: Not on file  . Ran Out of Food in the Last Year: Not on file  Transportation Needs:   . Lack of Transportation (Medical): Not on file  . Lack of Transportation (Non-Medical): Not on file  Physical Activity:   . Days of Exercise per Week: Not on file  . Minutes of Exercise per Session: Not on file  Stress:   . Feeling of Stress : Not on file  Social Connections:   . Frequency of Communication with Friends and Family: Not on file  . Frequency of Social Gatherings with Friends and Family: Not on  file  . Attends Religious Services: Not on file  . Active Member of Clubs or Organizations: Not on file  . Attends Archivist Meetings: Not on file  . Marital Status: Not on file  Intimate Partner Violence:   . Fear of Current or Ex-Partner: Not on file  . Emotionally Abused: Not on file  . Physically Abused: Not on file  . Sexually Abused: Not on file    Allergies  Allergen Reactions  . Diphenhydramine Other (See Comments)  . Clarithromycin Rash    Severe rash over entire body    Current Outpatient Medications  Medication Sig Dispense Refill  . amLODipine (NORVASC) 5 MG tablet Take 5 mg by mouth daily.    Marland Kitchen azithromycin (ZITHROMAX) 250 MG tablet Take by mouth daily.    . Calcium Carbonate-Vitamin D (CALCIUM PLUS VITAMIN D PO) Take 1 tablet by  mouth 2 (two) times daily.     . diphenhydrAMINE (SOMINEX) 25 MG tablet Take 25 mg by mouth at bedtime as needed for sleep.    . meclizine (ANTIVERT) 25 MG tablet Take 25 mg by mouth 3 (three) times daily as needed for dizziness.    . Multiple Vitamin (MULTIVITAMIN) capsule Take 1 capsule by mouth daily.    Marland Kitchen omeprazole (PRILOSEC) 40 MG capsule Take 1 capsule by mouth 2 (two) times daily before a meal.  0  . SUMAtriptan (IMITREX) 100 MG tablet Take 100 mg by mouth as needed for migraine. May repeat in 2 hours if headache persists or recurs.    Marland Kitchen atovaquone-proguanil (MALARONE) 250-100 MG TABS tablet TAKE 1 TABLET DAILY-BEGIN 2DAYS PRIOR TO TRAVEL & CONTINUE UNTIL 7 DAYS AFTER RETURN (Patient not taking: Reported on 01/29/2020)  0  . benzonatate (TESSALON) 100 MG capsule Take 1 capsule by mouth as needed. (Patient not taking: Reported on 01/29/2020)  0  . fexofenadine (ALLEGRA) 180 MG tablet Take 1 tablet by mouth daily. (Patient not taking: Reported on 01/29/2020)    . traMADol (ULTRAM) 50 MG tablet Take 1-2 tablets by mouth as needed. (Patient not taking: Reported on 01/29/2020)     No current facility-administered medications for this visit.    ROS:   General:  No weight loss, Fever, chills  HEENT: No recent headaches, no nasal bleeding, no visual changes, no sore throat  Neurologic: No dizziness, blackouts, seizures. No recent symptoms of stroke or mini- stroke. No recent episodes of slurred speech, or temporary blindness.  Cardiac: No recent episodes of chest pain/pressure, no shortness of breath at rest.  No shortness of breath with exertion.  Denies history of atrial fibrillation or irregular heartbeat  Vascular: No history of rest pain in feet.  No history of claudication.  No history of non-healing ulcer, No history of DVT   Pulmonary: No home oxygen, no productive cough, no hemoptysis,  No asthma or wheezing  Musculoskeletal:  [ ]  Arthritis, [ ]  Low back pain,  [ ]  Joint  pain  Hematologic:No history of hypercoagulable state.  No history of easy bleeding.  No history of anemia  Gastrointestinal: No hematochezia or melena,  No gastroesophageal reflux, no trouble swallowing  Urinary: [ ]  chronic Kidney disease, [ ]  on HD - [ ]  MWF or [ ]  TTHS, [ ]  Burning with urination, [ ]  Frequent urination, [ ]  Difficulty urinating;   Skin: No rashes  Psychological: No history of anxiety,  No history of depression   Physical Examination  Vitals:   02/14/20 0951  Resp: 16  Weight:  138 lb (62.6 kg)  Height: 5\' 5"  (1.651 m)    Body mass index is 22.96 kg/m.  General:  Alert and oriented, no acute distress HEENT: Normal Neck: No JVD Cardiac: Regular Rate and Rhythm Skin: No rash, Lester of reticular type varicosities posterior right knee similar findings left leg not as prominent and also lateral left knee Extremity Pulses:  2+ dorsalis pedis, posterior tibial pulses bilaterally Musculoskeletal: No deformity or edema  Neurologic: Upper and lower extremity motor 5/5 and symmetric  DATA:  I performed a SonoSite ultrasound of the patient's lower extremities today at the bedside.  Greater saphenous vein was about 2-1/2 to 3 mm in diameter.  It did enlarged to as much as 4 mm right at the saphenofemoral junction.  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  The patient's greater saphenous vein is not really dilated that much to consider laser ablation.  She may benefit from sclerotherapy of the area of inflammation behind her right knee and her left knee as well.  I discussed the procedure with her today.  She will call our vein nurse in the near future if she wishes to proceed.  I also discussed with her today the pathophysiology of varicose veins and reassured her that this does not put her at increased risk of DVT or pulmonary embolus or limb loss.   PLAN: Patient will follow up on an  as-needed basis if she wishes to pursue sclerotherapy at some point in the future.  Ruta Hinds, MD [facility] Office: [phone]

## 2020-02-19 ENCOUNTER — Ambulatory Visit: Payer: Medicare Other

## 2020-02-27 ENCOUNTER — Ambulatory Visit (INDEPENDENT_AMBULATORY_CARE_PROVIDER_SITE_OTHER): Payer: Self-pay

## 2020-02-27 ENCOUNTER — Other Ambulatory Visit: Payer: Self-pay

## 2020-02-27 DIAGNOSIS — I8393 Asymptomatic varicose veins of bilateral lower extremities: Secondary | ICD-10-CM

## 2020-02-27 NOTE — Progress Notes (Signed)
Treated pt's spider and reticular veins of both legs (front and back) with Aslcera 1% administered with a 27g butterfly.  Patient received a total of 6 mL. Larger reticular vein on back of R knee will likely need additional treatments, as well as some other areas with scattered spider veins. We discussed this and pt is aware. Anticipate good results. Pt was given post procedure care instructions both on handout and verbally. Will follow PRN.   Photos: Yes.    Compression stockings applied: Yes.

## 2020-03-11 DIAGNOSIS — H5203 Hypermetropia, bilateral: Secondary | ICD-10-CM | POA: Diagnosis not present

## 2020-03-11 DIAGNOSIS — Z961 Presence of intraocular lens: Secondary | ICD-10-CM | POA: Diagnosis not present

## 2020-03-11 DIAGNOSIS — H04123 Dry eye syndrome of bilateral lacrimal glands: Secondary | ICD-10-CM | POA: Diagnosis not present

## 2020-03-11 DIAGNOSIS — H524 Presbyopia: Secondary | ICD-10-CM | POA: Diagnosis not present

## 2020-03-27 ENCOUNTER — Ambulatory Visit
Admission: RE | Admit: 2020-03-27 | Discharge: 2020-03-27 | Disposition: A | Discharge status: Home / Self Care | Payer: Medicare Other | Source: Ambulatory Visit | Attending: Internal Medicine | Admitting: Internal Medicine

## 2020-03-27 ENCOUNTER — Other Ambulatory Visit: Payer: Self-pay

## 2020-03-27 DIAGNOSIS — Z1231 Encounter for screening mammogram for malignant neoplasm of breast: Secondary | ICD-10-CM | POA: Diagnosis not present

## 2020-03-28 ENCOUNTER — Telehealth: Payer: Self-pay

## 2020-03-28 NOTE — Telephone Encounter (Signed)
Called pt to see how sclerotherapy treatment went. She was pleased with results thus far. Feels the back of her knees have greatly improved but has a few spots that she feels will need to be retreated. She will call back to schedule when she is ready. No questions/concerns at this time.

## 2020-06-10 DIAGNOSIS — L570 Actinic keratosis: Secondary | ICD-10-CM | POA: Diagnosis not present

## 2020-06-10 DIAGNOSIS — Z85828 Personal history of other malignant neoplasm of skin: Secondary | ICD-10-CM | POA: Diagnosis not present

## 2020-06-10 DIAGNOSIS — L821 Other seborrheic keratosis: Secondary | ICD-10-CM | POA: Diagnosis not present

## 2020-06-10 DIAGNOSIS — L578 Other skin changes due to chronic exposure to nonionizing radiation: Secondary | ICD-10-CM | POA: Diagnosis not present

## 2020-06-10 DIAGNOSIS — D1801 Hemangioma of skin and subcutaneous tissue: Secondary | ICD-10-CM | POA: Diagnosis not present

## 2020-06-10 DIAGNOSIS — D2272 Melanocytic nevi of left lower limb, including hip: Secondary | ICD-10-CM | POA: Diagnosis not present

## 2020-06-10 DIAGNOSIS — L309 Dermatitis, unspecified: Secondary | ICD-10-CM | POA: Diagnosis not present

## 2020-08-05 ENCOUNTER — Ambulatory Visit: Payer: Medicare Other

## 2020-08-05 ENCOUNTER — Other Ambulatory Visit: Payer: Self-pay

## 2020-08-05 ENCOUNTER — Ambulatory Visit (INDEPENDENT_AMBULATORY_CARE_PROVIDER_SITE_OTHER): Payer: Self-pay

## 2020-08-05 DIAGNOSIS — I8393 Asymptomatic varicose veins of bilateral lower extremities: Secondary | ICD-10-CM

## 2020-08-05 NOTE — Progress Notes (Signed)
Treated pt's small reticular veins and spider veins with Asclera 1% administered with a 27g butterfly.  Patient received a total of 4 mL. Pt had a treatment last year with good results. She had some staining on back of R knee; we discussed this as she wanted this area to be retreated. Pt tolerated well. Post procedure care instructions provided both on handout and verbally. Will follow PRN.    Photos: Yes.    Compression stockings applied: Yes.

## 2020-08-15 DIAGNOSIS — R058 Other specified cough: Secondary | ICD-10-CM | POA: Diagnosis not present

## 2020-08-15 DIAGNOSIS — J383 Other diseases of vocal cords: Secondary | ICD-10-CM | POA: Diagnosis not present

## 2020-08-15 DIAGNOSIS — J470 Bronchiectasis with acute lower respiratory infection: Secondary | ICD-10-CM | POA: Diagnosis not present

## 2020-08-29 DIAGNOSIS — I700 Atherosclerosis of aorta: Secondary | ICD-10-CM | POA: Diagnosis not present

## 2020-08-29 DIAGNOSIS — J479 Bronchiectasis, uncomplicated: Secondary | ICD-10-CM | POA: Diagnosis not present

## 2020-08-29 DIAGNOSIS — R590 Localized enlarged lymph nodes: Secondary | ICD-10-CM | POA: Diagnosis not present

## 2020-08-29 DIAGNOSIS — R918 Other nonspecific abnormal finding of lung field: Secondary | ICD-10-CM | POA: Diagnosis not present

## 2020-08-29 DIAGNOSIS — J470 Bronchiectasis with acute lower respiratory infection: Secondary | ICD-10-CM | POA: Diagnosis not present

## 2020-09-16 DIAGNOSIS — J470 Bronchiectasis with acute lower respiratory infection: Secondary | ICD-10-CM | POA: Diagnosis not present

## 2020-09-16 DIAGNOSIS — A310 Pulmonary mycobacterial infection: Secondary | ICD-10-CM | POA: Diagnosis not present

## 2020-09-16 DIAGNOSIS — J383 Other diseases of vocal cords: Secondary | ICD-10-CM | POA: Diagnosis not present

## 2020-09-16 DIAGNOSIS — R053 Chronic cough: Secondary | ICD-10-CM | POA: Diagnosis not present

## 2020-10-17 DIAGNOSIS — Z23 Encounter for immunization: Secondary | ICD-10-CM | POA: Diagnosis not present

## 2020-11-21 ENCOUNTER — Ambulatory Visit (INDEPENDENT_AMBULATORY_CARE_PROVIDER_SITE_OTHER): Payer: Medicare Other | Admitting: Surgery

## 2020-11-21 ENCOUNTER — Ambulatory Visit (INDEPENDENT_AMBULATORY_CARE_PROVIDER_SITE_OTHER): Payer: Medicare Other

## 2020-11-21 ENCOUNTER — Encounter: Payer: Self-pay | Admitting: Surgery

## 2020-11-21 VITALS — BP 134/70 | HR 87 | Ht 65.0 in | Wt 138.0 lb

## 2020-11-21 DIAGNOSIS — M25562 Pain in left knee: Secondary | ICD-10-CM | POA: Diagnosis not present

## 2020-11-21 DIAGNOSIS — M419 Scoliosis, unspecified: Secondary | ICD-10-CM | POA: Diagnosis not present

## 2020-11-21 DIAGNOSIS — M1712 Unilateral primary osteoarthritis, left knee: Secondary | ICD-10-CM | POA: Diagnosis not present

## 2020-11-21 DIAGNOSIS — G8929 Other chronic pain: Secondary | ICD-10-CM

## 2020-11-21 DIAGNOSIS — M545 Low back pain, unspecified: Secondary | ICD-10-CM

## 2020-11-21 DIAGNOSIS — M533 Sacrococcygeal disorders, not elsewhere classified: Secondary | ICD-10-CM

## 2020-11-21 MED ORDER — BUPIVACAINE HCL 0.25 % IJ SOLN
6.0000 mL | INTRAMUSCULAR | Status: AC | PRN
  Administered 2020-11-21: 6 mL via INTRA_ARTICULAR

## 2020-11-21 NOTE — Progress Notes (Signed)
Office Visit Note   Patient: Shelia Cunningham           Date of Birth: May 31, 1940           MRN: [id number] Visit Date: 11/21/2020              Requested by: Ginger Organ., MD [address] [place],  [place] [address] PCP: Ginger Organ., MD   Assessment & Plan: Visit Diagnoses:  1. Chronic right-sided low back pain without sciatica   2. Acute pain of left knee   3. Chronic right SI joint pain   4. Primary osteoarthritis of left knee   5. Scoliosis of lumbar spine, unspecified scoliosis type     Plan: In hopes of giving patient some relief of her left knee pain offered injection.  Patient consent left knee was prepped with Betadine and intra-articular Marcaine/betamethasone injection was performed.  Tolerated well without complication.  I discussed with her the possibility of trying viscosupplementation if today's injection does not last long.  Regards to her right SI joint pain I will have her see Dr. [name] in a week for possible ultrasound-guided diagnostic/therapeutic right SI joint injection.  Patient does have multilevel lumbar spondylosis along with scoliosis but she localizes her pain to the right SI joint specifically.  I will have her see me in 4 weeks for recheck to see how she is doing from the injections.  Also reviewed x-rays with patient.  She does have near bone-on-bone changes left medial compartment and ultimately it could come down to needing total knee replacement in the future.  With patient's history of bronchiectasis I did ask her to contact her pulmonologist to see if it is okay for her to use oral NSAIDs.  All questions answered.  Follow-Up Instructions: Return in about 1 week (around 11/28/2020) for 1 week dr hilts to discuss right ultrasound guided SI joint injection and with Jeneen Rinks 4 weeks for ROV.   Orders:  Orders Placed This Encounter  Procedures   Large Joint Inj: L knee   XR Lumbar Spine 2-3 Views   XR Knee 1-2 Views Left   No orders of the  defined types were placed in this encounter.     Procedures: Large Joint Inj: L knee on 11/21/2020 10:47 AM Indications: pain Details: 25 G 1.5 in needle, anteromedial approach Medications: 6 mL bupivacaine 0.25 % Outcome: tolerated well, no immediate complications  Marc/betamethasone 6:1 Consent was given by the patient. Patient was prepped and draped in the usual sterile fashion.      Clinical Data: No additional findings.   Subjective: Chief Complaint  Patient presents with   Lower Back - Pain   Left Knee - Pain    HPI 80 year old white female who is new patient to clinic comes in today with complaints of right low back pain and left knee pain.  Patient states that she has had problems in both areas for several years.  She had x-rays taken of her back and left knee in 2010.  States that she has a known history of scoliosis.  No injury or falls.  Patient is very active with dancing.  She localizes low back pain to the right SI joint.  No radicular symptoms.  Pain when she is laying down and also when she is ambulating.  She also complains of left knee pain more along the medial joint line.  No mechanical symptoms or feeling of instability.  Has had some knee swelling.  Pain also when she is walking and goes from a sitting to standing position.  She is not taking any oral NSAIDs.  Patient does have a history of bronchiectasis.   Review of Systems No current cardiac pulmonary GI GU issues  Objective: Vital Signs: BP 134/70   Pulse 87   Ht 5\' 5"  (1.651 m)   Wt 138 lb (62.6 kg)   BMI 22.96 kg/m   Physical Exam Constitutional:      Comments: Extremely pleasant elderly white female alert and oriented in no acute distress.  HENT:     Head: Normocephalic and atraumatic.  Eyes:     Extraocular Movements: Extraocular movements intact.  Pulmonary:     Effort: No respiratory distress.  Musculoskeletal:     Comments: Gait is somewhat antalgic.  Patient has moderate tenderness  over the right SI joint.  Lumbar spine nontender.  Nontender over the bilateral greater trochanter bursa's.  Negative logroll bilateral hips.  Negative straight leg raise.  Left knee she has good range of motion.  Positive patellofemoral crepitus.  Positive patellar grind.  Some swelling without large effusion.  Marked tenderness over the medial joint line.  Ligaments are stable.  Calf nontender.  Right knee positive patellofemoral crepitus and otherwise unremarkable.  Neurological:     Mental Status: She is oriented to person, place, and time.  Psychiatric:        Mood and Affect: Mood normal.    Ortho Exam  Specialty Comments:  No specialty comments available.  Imaging: XR Knee 1-2 Views Left  Result Date: 11/21/2020 Left knee shows tricompartmental degenerative changes most noted in the medial compartment.  Medial compartment near bone-on-bone.  Periarticular spurs.  XR Lumbar Spine 2-3 Views  Result Date: 11/21/2020 Lumbar spine shows multilevel lumbar spondylosis.  Scoliosis.  No acute finding.  Degenerative changes bilateral SI joints but more so on the right.    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 only  11/09/2015   S/P laparoscopic appendectomy 10/26/2012   Acute appendicitis with perforation and peritoneal abscess 09/23/2012   Pneumonia 02/23/2012   Upper airway cough syndrome 02/11/2012   Past Medical History:  Diagnosis Date   Chronic migraine    GERD (gastroesophageal reflux disease)    Insomnia    Osteoporosis    Pneumonia, organism unspecified(486) 12-25-11   Vertigo, benign positional     Family History  Problem Relation Age of Onset   Breast cancer Mother 56   Bladder Cancer Father 56   Lung cancer Maternal Grandfather    Alcoholism Son     Past Surgical History:  Procedure  Laterality Date   CATARACT EXTRACTION, BILATERAL     IRRIGATION AND DEBRIDEMENT SEBACEOUS CYST     on scalp/ 2 cysts  removed   LAPAROSCOPIC APPENDECTOMY N/A 09/23/2012   Procedure: APPENDECTOMY LAPAROSCOPIC;  Surgeon: Zenovia Jarred, MD;  Location: [place];  Service: General;  Laterality: N/A;   TONSILLECTOMY AND ADENOIDECTOMY     Social History   Occupational History   Occupation: Retired Pharmacist, hospital  Tobacco Use   Smoking status: Never   Smokeless tobacco: Never  Substance and Sexual Activity   Alcohol use: Yes    Alcohol/week: 7.0 - 10.0 standard drinks    Types: 7 - 10 Glasses of wine per week    Comment: 1-2 glasses  of wine daily   Drug use: No   Sexual activity: Not on file

## 2020-11-26 ENCOUNTER — Ambulatory Visit (INDEPENDENT_AMBULATORY_CARE_PROVIDER_SITE_OTHER): Payer: Medicare Other | Admitting: Family Medicine

## 2020-11-26 ENCOUNTER — Other Ambulatory Visit: Payer: Self-pay

## 2020-11-26 ENCOUNTER — Ambulatory Visit: Payer: Self-pay

## 2020-11-26 DIAGNOSIS — G8929 Other chronic pain: Secondary | ICD-10-CM | POA: Diagnosis not present

## 2020-11-26 DIAGNOSIS — M545 Low back pain, unspecified: Secondary | ICD-10-CM

## 2020-11-26 NOTE — Progress Notes (Signed)
Subjective: Patient is here for ultrasound-guided intra-articular right SI joint injection.   Pain for about 6 months.  Objective: Tender directly over the right SI joint.  Procedure: Ultrasound guided injection is preferred based studies that show increased duration, increased effect, greater accuracy, decreased procedural pain, increased response rate, and decreased cost with ultrasound guided versus blind injection.   Verbal informed consent obtained.  Time-out conducted.  Noted no overlying erythema, induration, or other signs of local infection. Ultrasound-guided right SI injection: After sterile prep with Betadine, injected 4 cc 0.25% bupivocaine without epinephrine and 6 mg betamethasone using a 22-gauge spinal needle, passing the needle through the sacroiliac ligament into the region of the SI joint.  She had good relief during the anesthetic phase.

## 2020-12-09 DIAGNOSIS — Z0000 Encounter for general adult medical examination without abnormal findings: Secondary | ICD-10-CM | POA: Diagnosis not present

## 2020-12-09 DIAGNOSIS — M859 Disorder of bone density and structure, unspecified: Secondary | ICD-10-CM | POA: Diagnosis not present

## 2020-12-09 DIAGNOSIS — Z79899 Other long term (current) drug therapy: Secondary | ICD-10-CM | POA: Diagnosis not present

## 2020-12-16 DIAGNOSIS — J479 Bronchiectasis, uncomplicated: Secondary | ICD-10-CM | POA: Diagnosis not present

## 2020-12-16 DIAGNOSIS — M545 Low back pain, unspecified: Secondary | ICD-10-CM | POA: Diagnosis not present

## 2020-12-16 DIAGNOSIS — M419 Scoliosis, unspecified: Secondary | ICD-10-CM | POA: Diagnosis not present

## 2020-12-16 DIAGNOSIS — Z1331 Encounter for screening for depression: Secondary | ICD-10-CM | POA: Diagnosis not present

## 2020-12-16 DIAGNOSIS — Z2239 Carrier of other specified bacterial diseases: Secondary | ICD-10-CM | POA: Diagnosis not present

## 2020-12-16 DIAGNOSIS — Z0000 Encounter for general adult medical examination without abnormal findings: Secondary | ICD-10-CM | POA: Diagnosis not present

## 2020-12-16 DIAGNOSIS — F325 Major depressive disorder, single episode, in full remission: Secondary | ICD-10-CM | POA: Diagnosis not present

## 2020-12-16 DIAGNOSIS — M8580 Other specified disorders of bone density and structure, unspecified site: Secondary | ICD-10-CM | POA: Diagnosis not present

## 2020-12-16 DIAGNOSIS — G43909 Migraine, unspecified, not intractable, without status migrainosus: Secondary | ICD-10-CM | POA: Diagnosis not present

## 2020-12-16 DIAGNOSIS — Z1389 Encounter for screening for other disorder: Secondary | ICD-10-CM | POA: Diagnosis not present

## 2020-12-16 DIAGNOSIS — R82998 Other abnormal findings in urine: Secondary | ICD-10-CM | POA: Diagnosis not present

## 2020-12-16 DIAGNOSIS — R053 Chronic cough: Secondary | ICD-10-CM | POA: Diagnosis not present

## 2020-12-16 DIAGNOSIS — H8110 Benign paroxysmal vertigo, unspecified ear: Secondary | ICD-10-CM | POA: Diagnosis not present

## 2020-12-18 ENCOUNTER — Ambulatory Visit (INDEPENDENT_AMBULATORY_CARE_PROVIDER_SITE_OTHER): Payer: Medicare Other | Admitting: Surgery

## 2020-12-18 ENCOUNTER — Other Ambulatory Visit: Payer: Self-pay

## 2020-12-18 DIAGNOSIS — M1712 Unilateral primary osteoarthritis, left knee: Secondary | ICD-10-CM | POA: Diagnosis not present

## 2020-12-18 DIAGNOSIS — G8929 Other chronic pain: Secondary | ICD-10-CM

## 2020-12-18 DIAGNOSIS — M533 Sacrococcygeal disorders, not elsewhere classified: Secondary | ICD-10-CM | POA: Diagnosis not present

## 2020-12-18 NOTE — Progress Notes (Signed)
Office Visit Note   Patient: Shelia Cunningham           Date of Birth: 06-02-1940           MRN: [id number] Visit Date: 12/18/2020              Requested by: Ginger Organ., MD [address] [place],  [place] [address] PCP: Ginger Organ., MD   Assessment & Plan: Visit Diagnoses:  1. Chronic right SI joint pain   2. Primary osteoarthritis of left knee     Plan: With patient's ongoing left knee pain that is failed conservative treatment with recent injection recommended she follow-up with Dr. [name] to see if he thinks total knee replacement is an option.  All questions answered.  Follow-Up Instructions: Return in about 6 weeks (around 01/29/2021) for with dr yates recheck left knee .   Orders:  No orders of the defined types were placed in this encounter.  No orders of the defined types were placed in this encounter.     Procedures: No procedures performed   Clinical Data: No additional findings.   Subjective: Chief Complaint  Patient presents with   Lower Back - Follow-up    SI joint injection has helped, she got 90% relief from the injection    HPI 80 year old female returns for recheck of her right SI joint pain and left knee pain.  Last office visit with me November 21, 2020 I had injected left knee with Marcaine/Depo-Medrol and she states that this helped for a couple days.  I had also asked Dr. [name] to do a ultrasound-guided right SI joint injection and she states that this continues to do well.  Knee pain when she is up and ambulating.  She has known history of DJD. Review of Systems No current cardiac pulmonary GI GU issues  Objective: Vital Signs: There were no vitals taken for this visit.  Physical Exam HENT:     Head: Normocephalic and atraumatic.     Nose: Nose normal.  Pulmonary:     Effort: Pulmonary effort is normal.  Musculoskeletal:     Comments: Exam left knee positive crepitus.  Joint line tender.  No palpable effusion.   Neurological:     Mental Status: She is alert and oriented to person, place, and time.    Ortho Exam  Specialty Comments:  No specialty comments available.  Imaging: No results found.   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  11/09/2015   S/P laparoscopic appendectomy 10/26/2012   Acute appendicitis with perforation and peritoneal abscess 09/23/2012   Pneumonia 02/23/2012   Upper airway cough syndrome 02/11/2012   Past Medical History:  Diagnosis Date   Chronic migraine    GERD (gastroesophageal reflux disease)    Insomnia    Osteoporosis    Pneumonia, organism unspecified(486) 12-25-11   Vertigo, benign positional     Family History  Problem Relation Age of Onset   Breast cancer Mother 1   Bladder Cancer Father 66   Lung cancer Maternal Grandfather    Alcoholism Son     Past Surgical History:  Procedure Laterality Date   CATARACT EXTRACTION, BILATERAL     IRRIGATION AND DEBRIDEMENT SEBACEOUS CYST     on scalp/ 2 cysts  removed   LAPAROSCOPIC APPENDECTOMY N/A  09/23/2012   Procedure: APPENDECTOMY LAPAROSCOPIC;  Surgeon: Zenovia Jarred, MD;  Location: [facility] OR;  Service: General;  Laterality: N/A;   TONSILLECTOMY AND ADENOIDECTOMY     Social History   Occupational History   Occupation: Retired Pharmacist, hospital  Tobacco Use   Smoking status: Never   Smokeless tobacco: Never  Substance and Sexual Activity   Alcohol use: Yes    Alcohol/week: 7.0 - 10.0 standard drinks    Types: 7 - 10 Glasses of wine per week    Comment: 1-2 glasses of wine daily   Drug use: No   Sexual activity: Not on file

## 2020-12-19 ENCOUNTER — Ambulatory Visit: Payer: Medicare Other | Admitting: Surgery

## 2020-12-19 ENCOUNTER — Telehealth: Payer: Self-pay | Admitting: Surgery

## 2020-12-19 NOTE — Telephone Encounter (Signed)
Pt stated PA Ricard Dillon asked her to call with dosage of ibuprofen she is taking 400 mg twice daily. No call back needed.

## 2021-01-06 DIAGNOSIS — M8589 Other specified disorders of bone density and structure, multiple sites: Secondary | ICD-10-CM | POA: Diagnosis not present

## 2021-01-29 ENCOUNTER — Ambulatory Visit: Payer: Medicare Other | Admitting: Surgery

## 2021-02-19 DIAGNOSIS — Z23 Encounter for immunization: Secondary | ICD-10-CM | POA: Diagnosis not present

## 2021-02-21 ENCOUNTER — Other Ambulatory Visit: Payer: Self-pay | Admitting: Internal Medicine

## 2021-02-21 DIAGNOSIS — Z1231 Encounter for screening mammogram for malignant neoplasm of breast: Secondary | ICD-10-CM

## 2021-03-13 DIAGNOSIS — H52203 Unspecified astigmatism, bilateral: Secondary | ICD-10-CM | POA: Diagnosis not present

## 2021-03-13 DIAGNOSIS — H31003 Unspecified chorioretinal scars, bilateral: Secondary | ICD-10-CM | POA: Diagnosis not present

## 2021-03-13 DIAGNOSIS — H43813 Vitreous degeneration, bilateral: Secondary | ICD-10-CM | POA: Diagnosis not present

## 2021-03-27 DIAGNOSIS — M8580 Other specified disorders of bone density and structure, unspecified site: Secondary | ICD-10-CM | POA: Diagnosis not present

## 2021-04-08 ENCOUNTER — Ambulatory Visit
Admission: RE | Admit: 2021-04-08 | Discharge: 2021-04-08 | Disposition: A | Discharge status: Home / Self Care | Payer: Medicare Other | Source: Ambulatory Visit | Attending: Internal Medicine | Admitting: Internal Medicine

## 2021-04-08 DIAGNOSIS — Z1231 Encounter for screening mammogram for malignant neoplasm of breast: Secondary | ICD-10-CM

## 2021-04-11 ENCOUNTER — Other Ambulatory Visit (HOSPITAL_COMMUNITY): Payer: Self-pay | Admitting: *Deleted

## 2021-04-14 ENCOUNTER — Other Ambulatory Visit: Payer: Self-pay

## 2021-04-14 ENCOUNTER — Encounter (HOSPITAL_COMMUNITY)
Admission: RE | Admit: 2021-04-14 | Discharge: 2021-04-14 | Disposition: A | Discharge status: Home / Self Care | Payer: Medicare Other | Source: Ambulatory Visit | Attending: Internal Medicine | Admitting: Internal Medicine

## 2021-04-14 DIAGNOSIS — M810 Age-related osteoporosis without current pathological fracture: Secondary | ICD-10-CM | POA: Diagnosis not present

## 2021-04-14 MED ORDER — DENOSUMAB 60 MG/ML ~~LOC~~ SOSY
60.0000 mg | PREFILLED_SYRINGE | Freq: Once | SUBCUTANEOUS | Status: AC
  Administered 2021-04-14: 60 mg via SUBCUTANEOUS

## 2021-04-14 MED ORDER — DENOSUMAB 60 MG/ML ~~LOC~~ SOSY
PREFILLED_SYRINGE | SUBCUTANEOUS | Status: AC
  Filled 2021-04-14: qty 1

## 2021-05-15 ENCOUNTER — Ambulatory Visit: Payer: Medicare Other | Admitting: Surgery

## 2021-05-16 ENCOUNTER — Encounter: Payer: Self-pay | Admitting: Orthopaedic Surgery

## 2021-05-16 ENCOUNTER — Ambulatory Visit (INDEPENDENT_AMBULATORY_CARE_PROVIDER_SITE_OTHER): Payer: Medicare Other | Admitting: Orthopaedic Surgery

## 2021-05-16 ENCOUNTER — Ambulatory Visit (INDEPENDENT_AMBULATORY_CARE_PROVIDER_SITE_OTHER): Payer: Medicare Other

## 2021-05-16 ENCOUNTER — Other Ambulatory Visit: Payer: Self-pay

## 2021-05-16 VITALS — BP 150/70 | HR 53 | Ht 65.5 in | Wt 140.0 lb

## 2021-05-16 DIAGNOSIS — M25561 Pain in right knee: Secondary | ICD-10-CM

## 2021-05-16 DIAGNOSIS — M1711 Unilateral primary osteoarthritis, right knee: Secondary | ICD-10-CM | POA: Diagnosis not present

## 2021-05-16 NOTE — Progress Notes (Signed)
Office Visit Note   Patient: Shelia Cunningham           Date of Birth: 06-08-1940           MRN: [id number] Visit Date: 05/16/2021              Requested by: Ginger Organ., MD [address] [place],  [place] [address] PCP: Ginger Organ., MD   Assessment & Plan: Visit Diagnoses:  1. Acute pain of right knee   2. Unilateral primary osteoarthritis, right knee     Plan: Right knee injection performed which she tolerated well.  She can return if she is having ongoing symptoms.  We discussed treatment options.  She will call if the injection does not give her significant improvement.  Follow-Up Instructions: No follow-ups on file.   Orders:  Orders Placed This Encounter  Procedures   Large Joint Inj: R knee   XR KNEE 3 VIEW RIGHT   No orders of the defined types were placed in this encounter.     Procedures: Large Joint Inj: R knee on 05/16/2021 2:34 PM Indications: pain and joint swelling Details: 22 G 1.5 in needle, anterolateral approach  Arthrogram: No  Medications: 40 mg methylPREDNISolone acetate 40 MG/ML; 0.5 mL lidocaine 1 %; 4 mL bupivacaine 0.25 % Outcome: tolerated well, no immediate complications Procedure, treatment alternatives, risks and benefits explained, specific risks discussed. Consent was given by the patient. Immediately prior to procedure a time out was called to verify the correct patient, procedure, equipment, support staff and site/side marked as required. Patient was prepped and draped in the usual sterile fashion.      Clinical Data: No additional findings.   Subjective: Chief Complaint  Patient presents with   Right Knee - Pain    HPI 81 year old female seen with right knee pain onset 6 weeks ago progressively getting worse she is not noticed any swelling no locking.  She states she has difficulty going up and down stairs and states it feels like it will buckle some problems getting from sitting to standing.  She has used  ibuprofen 2 tablets 3 times a day with some relief.  No past history of surgery.  Review of Systems history of occasional chest pain with exertion.  Previous ruptured appendix surgery.  All other systems noncontributory to HPI.   Objective: Vital Signs: BP (!) 150/70    Pulse (!) 53    Ht 5' 5.5" (1.664 m)    Wt 140 lb (63.5 kg)    BMI 22.94 kg/m   Physical Exam Constitutional:      Appearance: She is well-developed.  HENT:     Head: Normocephalic.     Right Ear: External ear normal.     Left Ear: External ear normal. There is no impacted cerumen.  Eyes:     Pupils: Pupils are equal, round, and reactive to light.  Neck:     Thyroid: No thyromegaly.     Trachea: No tracheal deviation.  Cardiovascular:     Rate and Rhythm: Normal rate.  Pulmonary:     Effort: Pulmonary effort is normal.  Abdominal:     Palpations: Abdomen is soft.  Musculoskeletal:     Cervical back: No rigidity.  Skin:    General: Skin is warm and dry.  Neurological:     Mental Status: She is alert and oriented to person, place, and time.  Psychiatric:        Behavior: Behavior normal.  Ortho Exam patient has crepitus with range of motion tenderness with patellofemoral loading as well as medial joint line more than lateral.  Specialty Comments:  No specialty comments available.  Imaging: No results found.   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 only  11/09/2015   S/P laparoscopic appendectomy 10/26/2012   Acute appendicitis with perforation and peritoneal abscess 09/23/2012   Pneumonia 02/23/2012   Upper airway cough syndrome 02/11/2012   Past Medical History:  Diagnosis Date   Chronic migraine    GERD (gastroesophageal reflux disease)    Insomnia    Osteoporosis    Pneumonia, organism unspecified(486) 12-25-11    Vertigo, benign positional     Family History  Problem Relation Age of Onset   Breast cancer Mother 61   Bladder Cancer Father 67   Lung cancer Maternal Grandfather    Alcoholism Son     Past Surgical History:  Procedure Laterality Date   CATARACT EXTRACTION, BILATERAL     IRRIGATION AND DEBRIDEMENT SEBACEOUS CYST     on scalp/ 2 cysts  removed   LAPAROSCOPIC APPENDECTOMY N/A 09/23/2012   Procedure: APPENDECTOMY LAPAROSCOPIC;  Surgeon: Zenovia Jarred, MD;  Location: [facility];  Service: General;  Laterality: N/A;   TONSILLECTOMY AND ADENOIDECTOMY     Social History   Occupational History   Occupation: Retired Pharmacist, hospital  Tobacco Use   Smoking status: Never   Smokeless tobacco: Never  Substance and Sexual Activity   Alcohol use: Yes    Alcohol/week: 7.0 - 10.0 standard drinks    Types: 7 - 10 Glasses of wine per week    Comment: 1-2 glasses of wine daily   Drug use: No   Sexual activity: Not on file

## 2021-05-18 MED ORDER — LIDOCAINE HCL 1 % IJ SOLN
0.5000 mL | INTRAMUSCULAR | Status: AC | PRN
  Administered 2021-05-16: .5 mL

## 2021-05-18 MED ORDER — BUPIVACAINE HCL 0.25 % IJ SOLN
4.0000 mL | INTRAMUSCULAR | Status: AC | PRN
  Administered 2021-05-16: 4 mL via INTRA_ARTICULAR

## 2021-05-18 MED ORDER — METHYLPREDNISOLONE ACETATE 40 MG/ML IJ SUSP
40.0000 mg | INTRAMUSCULAR | Status: AC | PRN
  Administered 2021-05-16: 40 mg via INTRA_ARTICULAR

## 2021-07-08 DIAGNOSIS — L570 Actinic keratosis: Secondary | ICD-10-CM | POA: Diagnosis not present

## 2021-07-08 DIAGNOSIS — D2272 Melanocytic nevi of left lower limb, including hip: Secondary | ICD-10-CM | POA: Diagnosis not present

## 2021-07-08 DIAGNOSIS — D1801 Hemangioma of skin and subcutaneous tissue: Secondary | ICD-10-CM | POA: Diagnosis not present

## 2021-07-08 DIAGNOSIS — L821 Other seborrheic keratosis: Secondary | ICD-10-CM | POA: Diagnosis not present

## 2021-07-08 DIAGNOSIS — L720 Epidermal cyst: Secondary | ICD-10-CM | POA: Diagnosis not present

## 2021-07-08 DIAGNOSIS — L820 Inflamed seborrheic keratosis: Secondary | ICD-10-CM | POA: Diagnosis not present

## 2021-07-08 DIAGNOSIS — C44729 Squamous cell carcinoma of skin of left lower limb, including hip: Secondary | ICD-10-CM | POA: Diagnosis not present

## 2021-07-08 DIAGNOSIS — D485 Neoplasm of uncertain behavior of skin: Secondary | ICD-10-CM | POA: Diagnosis not present

## 2021-07-08 DIAGNOSIS — C44612 Basal cell carcinoma of skin of right upper limb, including shoulder: Secondary | ICD-10-CM | POA: Diagnosis not present

## 2021-07-08 DIAGNOSIS — D2271 Melanocytic nevi of right lower limb, including hip: Secondary | ICD-10-CM | POA: Diagnosis not present

## 2021-07-08 DIAGNOSIS — Z85828 Personal history of other malignant neoplasm of skin: Secondary | ICD-10-CM | POA: Diagnosis not present

## 2021-08-05 NOTE — Progress Notes (Signed)
? ?NEW PATIENT ?Date of Service/Encounter:  08/06/21 ?Referring provider: Ginger Organ., MD ?Primary care provider: Ginger Organ., MD ? ?Subjective:  ?Shelia Cunningham is a 81 y.o. female with a PMHx of upper airway cough syndrome, recurrent pneumonias, Mycobacterium AVM intracellulare (clinical diagnosis), angina pectoris syndrome presenting today for evaluation of hives ?History obtained from: chart review and patient. ? ?Hives: Have occurred on 2 separate occasions.   ? ?In [place] was working with [employer] and weeding.  Later that evening was covered in welts.  She thinks it was from an extreme reaction to chiggers which she has had similar reactions in smaller amounts at this [facility].  Hives lasted around 5 days, no other systemic reactions. ? ?In December 2022, she woke up in the middle of the night and felt an itching coursing through her body and was also covered in welts lasting for about 5 days.  She treated with Benadryl and hydrocortisone.  The only thing she did this day was playing with her dog which she does on many other days.  There were lots of leaf piles outside at the time. ? ?Denies any nausea, vomiting, swelling, diarrhea, trouble breathing, etc. with either of these episodes ? ?Medication allergy-she does get a rash from clarithyromycin-she states this rash was different than the hives she got bring her in today.  It started while she was taking this medication for 2-year.  For Mycobacterium infection.  The rash did resolve upon discontinuation of this medication. ? ?She is allergic to poison ivy, has been her whole life.  She is also reactive toward chiggers, gets large very itchy red bumps that form whelps around panty lines and bra lines. ? ?She has not taken any antihistamines in the past 3 days.  Takes a sleep aid that has the same ingredient as benadryl because fatigue can lead to migraines for her.  ? ?She is noticing that she is sneezing and coughing a little more than  normal ? ?Additionally, she has a history of bronchiectasis which was thought to stem from 3 pneumonias as an adult, starting in her 67s.  She denies recurrent ear or sinus infections.  Denies recurrent abscesses.  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  She does not remember ever having a bronchoscopy. ? ?Per chart review, patient is followed by Pulmonary with last visit on 09/16/20 for history of bronchiectasis, persistent cough, vocal cord atrophy and MAI.  She was unable to take azithromycin due to drug allergy.  She uses flutter valve QID, assist vest, and mucinex.   ? ?CT scan on 08/29/20-Spectrum of pulmonary parenchymal findings compatible with chronic atypical mycobacterial infection (MAI) with widespread moderate bronchiectasis, scattered mucoid impaction and tree-in-bud opacities. Findings have mildly progressed since 12/16/2017 chest CT.  ?2. Aortic Atherosclerosis (ICD10-I70.0) ? ?2017: respiratory allergen was negative ?2018: cat 0.28, dog 0.15, grass 0.11, timothy 0.10, otherwise negative environmental allergen ?2018 total IgE 182, negative QuantiFERON gold, nondetectable Aspergillus galactomannan, negative hypersensitivity pneumonitis screen ? ?She has had several sputum cultures positive for Staph aureus, last in 2018, repeat cultures in 2019 showed normal throat flora ? ?Other allergy screening: ?Asthma: no-but has bronchiectasis, has failed  ?Rhino conjunctivitis: no ?Food allergy: no ?Medication allergy: yes-clarithromycin ?Eczema:no ?History of recurrent infections suggestive of immunodeficency: yes ?Vaccinations are up to date.  ? ?Past Medical History: ?Past Medical History:  ?Diagnosis Date  ? Chronic migraine   ? GERD (gastroesophageal  reflux disease)   ? Insomnia   ? Osteoporosis   ? Pneumonia, organism unspecified(486) 12-25-11  ? Vertigo, benign positional   ? ?Medication List:  ?Current Outpatient  Medications  ?Medication Sig Dispense Refill  ? amLODipine (NORVASC) 5 MG tablet Take 5 mg by mouth daily.    ? Calcium Carbonate-Vitamin D (CALCIUM PLUS VITAMIN D PO) Take 1 tablet by mouth 2 (two) times daily.     ? denosumab (PROLIA) 60 MG/ML SOSY injection Inject 60 mg into the skin every 6 (six) months.    ? diphenhydrAMINE (SOMINEX) 25 MG tablet Take 25 mg by mouth at bedtime as needed for sleep.    ? ibuprofen (ADVIL) 200 MG tablet Take 200 mg by mouth. Taking '400mg'$  3x daily    ? meclizine (ANTIVERT) 25 MG tablet Take 25 mg by mouth 3 (three) times daily as needed for dizziness.    ? Multiple Vitamin (MULTIVITAMIN) capsule Take 1 capsule by mouth daily.    ? omeprazole (PRILOSEC) 40 MG capsule Take 1 capsule by mouth 2 (two) times daily before a meal.  0  ? SUMAtriptan (IMITREX) 100 MG tablet Take 100 mg by mouth as needed for migraine. May repeat in 2 hours if headache persists or recurs.    ? benzonatate (TESSALON) 100 MG capsule Take 1 capsule by mouth as needed. (Patient not taking: Reported on 08/06/2021)  0  ? ?No current facility-administered medications for this visit.  ? ?Known Allergies:  ?Allergies  ?Allergen Reactions  ? Alendronate Sodium   ?  Other reaction(s): chronic cough / reflux  ? Diphenhydramine Other (See Comments)  ? Clarithromycin Rash  ?  Severe rash over entire body  ? ?Past Surgical History: ?Past Surgical History:  ?Procedure Laterality Date  ? ADENOIDECTOMY    ? CATARACT EXTRACTION, BILATERAL    ? cells removed    ? IRRIGATION AND DEBRIDEMENT SEBACEOUS CYST    ? on scalp/ 2 cysts  removed  ? LAPAROSCOPIC APPENDECTOMY N/A 09/23/2012  ? Procedure: APPENDECTOMY LAPAROSCOPIC;  Surgeon: Zenovia Jarred, MD;  Location: [facility];  Service: General;  Laterality: N/A;  ? TONSILLECTOMY    ? TONSILLECTOMY AND ADENOIDECTOMY    ? ?Family History: ?Family History  ?Problem Relation Age of Onset  ? Breast cancer Mother 39  ? Bladder Cancer Father 80  ? Lung cancer Maternal Grandfather   ?  Alcoholism Son   ? Allergic rhinitis Neg Hx   ? Angioedema Neg Hx   ? Asthma Neg Hx   ? Atopy Neg Hx   ? Eczema Neg Hx   ? Urticaria Neg Hx   ? Immunodeficiency Neg Hx   ? ?Social History: Dynasia lives in a home built 35 years ago, dry environment, area rugs, gas and heat pump heating and cooling, nonshadowing indoor dog, occasional wood roaches in the home, bed is 2 feet off the floor and using plastic over mattress but not pillows, no smoke exposures.  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  No hobbies exposing her to fumes chemicals or dust.  ? ?ROS:  ?All other systems negative except as noted per HPI. ? ?Objective:  ?Blood pressure 130/68, pulse 80, temperature (!) 97.3 ?F (36.3 ?C), temperature source Temporal, resp. rate 18, height '5\' 6"'$  (1.676 m), weight 142 lb 12.8 oz (64.8 kg), SpO2 98 %. ?Body mass index is 23.05 kg/m?Marland Kitchen ?Physical Exam: ? ?General Appearance:  Alert, cooperative, no distress, appears stated age  ?Head:  Normocephalic, without  obvious abnormality, atraumatic  ?Eyes:  Conjunctiva clear, EOM's intact  ?Nose: Nares normal,  mildly edematous, normal mucosa, no visible anterior polyps, and septum midline  ?Throat: Lips, tongue normal; teeth and gums normal,  heard throat clearing throughout exam and normal posterior oropharynx  ?Neck: Supple, symmetrical  ?Lungs:   clear to auscultation bilaterally, Respirations unlabored, intermittent dry coughing  ?Heart:  regular rate and rhythm and no murmur, Appears well perfused  ?Extremities: No edema  ?Skin: Skin color, texture, turgor normal, no rashes or lesions on visualized portions of skin  ?Neurologic: No gross deficits  ? ? ? ?Diagnostics: ?Skin Testing: Environmental allergy panel. ? Adequate controls. ?Results discussed with patient/family. ? Airborne Adult Perc - 08/06/21 [phone]   ? ? Time Antigen Placed 0940   ? Allergen Manufacturer Lavella Hammock   ? Location Back   ? Number of Test 59   ? Panel 1 Select   ? 1. Control-Buffer 50%  Glycerol Negative   ? 2. Control-Histamine 1 mg/ml 3+   ? 3. Albumin saline Negative   ? 4. [place] Negative   ? 5. [place] Negative   ? 6. [place] Negative   ? 7. [place] Blue Negative   ? 8. [place] Negative   ?

## 2021-08-06 ENCOUNTER — Encounter: Payer: Self-pay | Admitting: Internal Medicine

## 2021-08-06 ENCOUNTER — Ambulatory Visit (INDEPENDENT_AMBULATORY_CARE_PROVIDER_SITE_OTHER): Payer: Medicare Other | Admitting: Internal Medicine

## 2021-08-06 ENCOUNTER — Other Ambulatory Visit: Payer: Self-pay

## 2021-08-06 VITALS — BP 130/68 | HR 80 | Temp 97.3°F | Resp 18 | Ht 66.0 in | Wt 142.8 lb

## 2021-08-06 DIAGNOSIS — J310 Chronic rhinitis: Secondary | ICD-10-CM | POA: Diagnosis not present

## 2021-08-06 DIAGNOSIS — B999 Unspecified infectious disease: Secondary | ICD-10-CM

## 2021-08-06 DIAGNOSIS — J302 Other seasonal allergic rhinitis: Secondary | ICD-10-CM

## 2021-08-06 DIAGNOSIS — R0989 Other specified symptoms and signs involving the circulatory and respiratory systems: Secondary | ICD-10-CM

## 2021-08-06 DIAGNOSIS — L500 Allergic urticaria: Secondary | ICD-10-CM

## 2021-08-06 DIAGNOSIS — J470 Bronchiectasis with acute lower respiratory infection: Secondary | ICD-10-CM | POA: Diagnosis not present

## 2021-08-06 NOTE — Patient Instructions (Addendum)
History of acute hives lasting 5 days:  ?- environmental allergy skin testing showed positives to indoor and outdoor molds, dust mites, dog and mixed feathers (down feathers) ?- allergen avoidance as below ?-Should hives return, start nonsedating antihistamine, see options below, can take 1 tablet twice daily.  Maximum dose is 2 tablets twice daily ?-Make note of any activities or food eaten within a 4-hour period before hives begin if these recur ?- labs: alpha gal (mammalian meat allergy), tryptase (measures mast cells), and cu index (looks for the type of hives caused by antibodies towards mast cells) ? ?Chronic rhinitis/post nasal drainage ?- see skin testing results above and allergen avoidance measures below ?-Start Nasacort (triamcinolone or Nasonex (mometasone) or Flonase Sensimist (fluticasone)-2 sprays in each nostril once daily, point tip towards ear lobe when spraying ?-Consider taking a nonsedating antihistamine daily as needed ? - Your options include: Zyrtec (cetirizine) 10 mg, Claritin (loratadine) 10 mg, Xyzal (levocetirizine) 5 mg or Allegra (fexofenadine) 180 mg daily as needed ? ?Recurrent pneumonias, history of Mycobacterium avium (MAI) and bronchiectasis ?-Will obtain labs to look at your immune system and contact you with results once available ? ?Clarithryomycin/azithromycin allergy: delayed drug eruption ?- strict avoidance ? ?Follow-up pending test results. ?It was a pleasure meeting you in clinic today ? ?Control of Dog or Cat Allergen ? ?Avoidance is the best way to manage a dog or cat allergy. If you have a dog or cat and are allergic to dog or cats, consider removing the dog or cat from the home. ?If you have a dog or cat but don?t want to find it a new home, or if your family wants a pet even though someone in the household is allergic, here are some strategies that may help keep symptoms at bay: ? ?Keep the pet out of your bedroom and restrict it to only a few rooms. Be advised that  keeping the dog or cat in only one room will not limit the allergens to that room. ?Don?t pet, hug or kiss the dog or cat; if you do, wash your hands with soap and water. ?High-efficiency particulate air (HEPA) cleaners run continuously in a bedroom or living room can reduce allergen levels over time. ?Regular use of a high-efficiency vacuum cleaner or a central vacuum can reduce allergen levels. ?Giving your dog or cat a bath at least once a week can reduce airborne allergen. ? ?DUST MITE AVOIDANCE MEASURES: ? ?There are three main measures that need and can be taken to avoid house dust mites: ? ?Reduce accumulation of dust in general ?-reduce furniture, clothing, carpeting, books, stuffed animals, especially in bedroom ? ?Separate yourself from the dust ?-use pillow and mattress encasements (can be found at stores such as [employer] or online) ?-avoid direct exposure to air condition flow ?-use a HEPA filter device, especially in the bedroom; you can also use a HEPA filter vacuum cleaner ?-wipe dust with a moist towel instead of a dry towel or broom when cleaning ? ?Decrease mites and/or their secretions ?-wash clothing and linen and stuffed animals at highest temperature possible, at least every 2 weeks ?-stuffed animals can also be placed in a bag and put in a freezer overnight ? ?Despite the above measures, it is impossible to eliminate dust mites or their allergen completely from your home.  With the above measures the burden of mites in your home can be diminished, with the goal of minimizing your allergic symptoms.  Success will be reached only  when implementing and using all means together. ? ?Control of Mold Allergen  ? ?Mold and fungi can grow on a variety of surfaces provided certain temperature and moisture conditions exist.  Outdoor molds grow on plants, decaying vegetation and soil.  The major outdoor mold, Alternaria and Cladosporium, are found in very high numbers during hot and dry  conditions.  Generally, a late Summer - Fall peak is seen for common outdoor fungal spores.  Rain will temporarily lower outdoor mold spore count, but counts rise rapidly when the rainy period ends.  The most important indoor molds are Aspergillus and Penicillium.  Dark, humid and poorly ventilated basements are ideal sites for mold growth.  The next most common sites of mold growth are the bathroom and the kitchen. ? ?Outdoor (Seasonal) Mold Control ? ?Use air conditioning and keep windows closed ?Avoid exposure to decaying vegetation. ?Avoid leaf raking. ?Avoid grain handling. ?Consider wearing a face mask if working in moldy areas.  ? ? ?Indoor (Perennial) Mold Control  ? ?Maintain humidity below 50%. ?Clean washable surfaces with 5% bleach solution. ?Remove sources e.g. contaminated carpets. ? ? ?CHRONIC THROAT CLEARING-WHAT TO DO!! ?Causes of chronic throat clearing may include the following (among others):  ?Acid reflux (lanyngopharyngeal reflux) ?Allergies (pollens, pet dander, dust mites, etc) ?Non allergic rhinitis (runny nose, post nasal drainage or congestion NOT caused by allergies-can be secondary to pollutants, cold air, changes in blood vessels to the nose as we age,etc) ?Environmental irritants (tobacco, smoke, air pollution) ?Asthma ?If present for a long period of time, throat clearing can become a habit. ?We will work with you to treat any of the medical reasons for throat clearing.  Your job is to help prevent the habit, which can cause damage (redness and swelling) to your vocal cords.  It will require a conscious effort on your behalf. ? ?Tips for prevention of throat clearing:  ?Instead of clearing your throat, swallow instead.   ?Carrying around water (or something to drink) will help you move the mucus in the right direction.  IF you have the urge to clear your throat, drink your water. ?If you absolutely have to clear your throat, use a non-traumatic exercise to do so.   ?Pant with your  mouth open saying ?[place], [place], [place]? with a powerful, breathy voice. ?Increase water intake.  This thins secretions, making them easier to swallow. ?Chew baking soda gum ([employer]) which can help with swallowing, reflux, and throat clearing.  Try to chew up to three times daily.   If you experience jaw pain or headaches, decrease the amount of chewing. ?Suck on sugar free hard candy to help with swallowing. ?Have your friends and family remind you to swallow when they hear you throat clearing.  As this can be habit forming, sometimes you may not realize you are doing this.  Having someone point it out to you, will help you become more conscious of the behavior. ?BE PATIENT.  This will take time to resolve, and some do not see improvement until 8-12 weeks into therapy/behavior modifications. ? ?

## 2021-08-08 ENCOUNTER — Other Ambulatory Visit: Payer: Self-pay | Admitting: *Deleted

## 2021-08-08 LAB — ALPHA-GAL PANEL
Allergen Lamb IgE: 1.16 kU/L — AB
Beef IgE: 1.39 kU/L — AB
IgE (Immunoglobulin E), Serum: 64 IU/mL (ref 6–495)
O215-IgE Alpha-Gal: 14.9 kU/L — AB
Pork IgE: 0.3 kU/L — AB

## 2021-08-08 MED ORDER — EPINEPHRINE 0.3 MG/0.3ML IJ SOAJ: 0.3000 mg | Freq: Once | INTRAMUSCULAR | 1 refills | Status: AC

## 2021-08-08 NOTE — Progress Notes (Signed)
Please send in an epinephrine autoinjector as well as mail out an allergy action plan to her home with alpha gal allergy (pork, lamb, beef and other mammalian meats) as the allergen.  Her dose of Benadryl would be 2 capsules of the 25 mg.  Dose of epinephrine would be 0.3 mg. ?Thanks

## 2021-08-15 LAB — LYMPH ENUMERATION, BASIC & NK CELLS
% CD 3 Pos. Lymph.: 63.8 % (ref 57.5–86.2)
% CD 4 Pos. Lymph.: 39.9 % (ref 30.8–58.5)
% NK (CD56/16): 16.6 % (ref 1.4–19.4)
Ab NK (CD56/16): 299 /uL (ref 24–406)
Absolute CD 3: 1148 /uL (ref 622–2402)
Absolute CD 4 Helper: 718 /uL (ref 359–1519)
Basophils Absolute: 0 10*3/uL (ref 0.0–0.2)
Basos: 1 %
CD19 % B Cell: 18.9 % (ref 3.3–25.4)
CD19 Abs: 340 /uL (ref 12–645)
CD4/CD8 Ratio: 2.16 (ref 0.92–3.72)
CD8 % Suppressor T Cell: 18.5 % (ref 12.0–35.5)
CD8 T Cell Abs: 333 /uL (ref 109–897)
EOS (ABSOLUTE): 0.1 10*3/uL (ref 0.0–0.4)
Eos: 2 %
Hematocrit: 40.5 % (ref 34.0–46.6)
Hemoglobin: 13.1 g/dL (ref 11.1–15.9)
Immature Grans (Abs): 0 10*3/uL (ref 0.0–0.1)
Immature Granulocytes: 1 %
Lymphocytes Absolute: 1.8 10*3/uL (ref 0.7–3.1)
Lymphs: 35 %
MCH: 30.4 pg (ref 26.6–33.0)
MCHC: 32.3 g/dL (ref 31.5–35.7)
MCV: 94 fL (ref 79–97)
Monocytes Absolute: 0.4 10*3/uL (ref 0.1–0.9)
Monocytes: 8 %
Neutrophils Absolute: 2.8 10*3/uL (ref 1.4–7.0)
Neutrophils: 53 %
Platelets: 221 10*3/uL (ref 150–450)
RBC: 4.31 x10E6/uL (ref 3.77–5.28)
RDW: 12.2 % (ref 11.7–15.4)
WBC: 5.1 10*3/uL (ref 3.4–10.8)

## 2021-08-15 LAB — STREP PNEUMONIAE 23 SEROTYPES IGG
Pneumo Ab Type 1*: >14.2 ug/mL (ref >1.3)
Pneumo Ab Type 12 (12F)*: 0.7 ug/mL — ABNORMAL LOW (ref >1.3)
Pneumo Ab Type 14*: >18.7 ug/mL (ref >1.3)
Pneumo Ab Type 17 (17F)*: 8.2 ug/mL (ref >1.3)
Pneumo Ab Type 19 (19F)*: >30.3 ug/mL (ref >1.3)
Pneumo Ab Type 2*: >20.7 ug/mL (ref >1.3)
Pneumo Ab Type 20*: 10.4 ug/mL (ref >1.3)
Pneumo Ab Type 22 (22F)*: >26 ug/mL (ref >1.3)
Pneumo Ab Type 23 (23F)*: >14.4 ug/mL (ref >1.3)
Pneumo Ab Type 26 (6B)*: >41.4 ug/mL (ref >1.3)
Pneumo Ab Type 3*: 3.7 ug/mL (ref >1.3)
Pneumo Ab Type 34 (10A)*: >19.5 ug/mL (ref >1.3)
Pneumo Ab Type 4*: 6 ug/mL (ref >1.3)
Pneumo Ab Type 43 (11A)*: 1.8 ug/mL (ref >1.3)
Pneumo Ab Type 5*: 2.3 ug/mL (ref >1.3)
Pneumo Ab Type 51 (7F)*: >13.6 ug/mL (ref >1.3)
Pneumo Ab Type 54 (15B)*: 14.9 ug/mL (ref >1.3)
Pneumo Ab Type 56 (18C)*: 5.4 ug/mL (ref >1.3)
Pneumo Ab Type 57 (19A)*: >33.6 ug/mL (ref >1.3)
Pneumo Ab Type 68 (9V)*: >13.4 ug/mL (ref >1.3)
Pneumo Ab Type 70 (33F)*: >10.1 ug/mL (ref >1.3)
Pneumo Ab Type 8*: 5.5 ug/mL (ref >1.3)
Pneumo Ab Type 9 (9N)*: 7 ug/mL (ref >1.3)

## 2021-08-15 LAB — DIPHTHERIA / TETANUS ANTIBODY PANEL
Diphtheria Ab: <0.1 IU/mL — ABNORMAL LOW (ref <0.10)
Tetanus Ab, IgG: 0.73 IU/mL (ref <0.10)

## 2021-08-15 LAB — IGG, IGA, IGM
IgA/Immunoglobulin A, Serum: 337 mg/dL (ref 64–422)
IgG (Immunoglobin G), Serum: 1180 mg/dL (ref 586–1602)
IgM (Immunoglobulin M), Srm: 105 mg/dL (ref 26–217)

## 2021-08-15 LAB — CHRONIC URTICARIA: cu index: 3.6 (ref <10)

## 2021-08-15 LAB — TRYPTASE: Tryptase: 5.8 ug/L (ref 2.2–13.2)

## 2021-08-26 ENCOUNTER — Other Ambulatory Visit: Payer: Self-pay | Admitting: Internal Medicine

## 2021-08-26 DIAGNOSIS — B999 Unspecified infectious disease: Secondary | ICD-10-CM

## 2021-08-26 NOTE — Progress Notes (Signed)
Please let Shelia Cunningham know that the rest of her immune labs have returned.  Overall everything is very reassuring except that she does not have adequate coverage for the diphtheria vaccine.  If it has been more than 5 years since her last diphtheria, would recommend that she get a booster at her PCP office and we can retest this in no sooner than 6 weeks.  I will place lab orders now. ? ? ? ?Thanks.

## 2021-09-04 DIAGNOSIS — Z23 Encounter for immunization: Secondary | ICD-10-CM | POA: Diagnosis not present

## 2021-09-16 ENCOUNTER — Other Ambulatory Visit (HOSPITAL_COMMUNITY): Payer: Self-pay | Admitting: *Deleted

## 2021-09-17 ENCOUNTER — Ambulatory Visit (HOSPITAL_COMMUNITY)
Admission: RE | Admit: 2021-09-17 | Discharge: 2021-09-17 | Disposition: A | Discharge status: Home / Self Care | Payer: Medicare Other | Source: Ambulatory Visit | Attending: Internal Medicine | Admitting: Internal Medicine

## 2021-09-17 DIAGNOSIS — M810 Age-related osteoporosis without current pathological fracture: Secondary | ICD-10-CM | POA: Insufficient documentation

## 2021-09-17 MED ORDER — DENOSUMAB 60 MG/ML ~~LOC~~ SOSY: 60.0000 mg | PREFILLED_SYRINGE | Freq: Once | SUBCUTANEOUS | Status: AC

## 2021-09-17 MED ORDER — DENOSUMAB 60 MG/ML ~~LOC~~ SOSY
PREFILLED_SYRINGE | SUBCUTANEOUS | Status: AC
  Administered 2021-09-17: 60 mg via SUBCUTANEOUS
  Filled 2021-09-17: qty 1

## 2021-09-19 DIAGNOSIS — J470 Bronchiectasis with acute lower respiratory infection: Secondary | ICD-10-CM | POA: Diagnosis not present

## 2021-10-20 DIAGNOSIS — Z85828 Personal history of other malignant neoplasm of skin: Secondary | ICD-10-CM | POA: Diagnosis not present

## 2021-10-20 DIAGNOSIS — L738 Other specified follicular disorders: Secondary | ICD-10-CM | POA: Diagnosis not present

## 2021-10-21 NOTE — Progress Notes (Unsigned)
FOLLOW UP Date of Service/Encounter:  10/22/21   Subjective:  Shelia Cunningham (DOB: Jul 15, 1940) is a 81 y.o. female PMHx of upper airway cough syndrome, recurrent pneumonias, Mycobacterium AVM intracellulare (clinical diagnosis), angina pectoris syndrome who returns to the [facility] on 10/22/2021 in re-evaluation of the following: alpha gal syndrome, history of bronchiectasis History obtained from: chart review and patient.  For Review, LV was on 08/06/21  with Dr.Jeramiah Mccaughey seen for intial visit for hives, allergic reaction, medication allergy to clarithromycin, bronchiectasis .  She was found to have alpha-gal allergy based on testing which we thought might be cause of her hives and advised avoidance, prescribed epipen.   We did do an immune evaluation due to history of bronchiectasis.  Overall hearing evaluation was reassuring with the exception of an adequate diphtheria titers.  I have asked that she have a booster with plan to repeat titers 6 weeks following that booster. She does continue to follow with Pulmonary for her bronchiectasis.  Her last visit was 09/19/2021.  She was encouraged to continue managing reflux.  She was diagnosed with acute lower respiratory infection Pertinent History/Diagnostics:  - Allergic Rhinitis: Hesitant to try Atrovent because dry mucous membranes is driving some of her chronic throat clearing.  She also has a history of migraines, so we advised against Flonase due to its irritant effects.  - SPT environmental panel (08/06/21): + penicillium, bipolaris, dust mites, dog, mixed feathers, horse  - 2017: respiratory allergen serum panel was negative - 2018: cat 0.28, dog 0.15, grass 0.11, timothy 0.10, otherwise negative environmental allergen - History of hives:   - December 2022, she woke up in the middle of the night and felt an itching coursing through her body and was also covered in welts lasting for about 5 days.    - tryptase baseline normal  (5.8), CU index 3.6, alpha gal elevated 14.90, beef 1.39, lamb 1.16 - bronchiectasis without asthma: thought to stem from 3 pneumonias as an adult, starting in her 32s.  She denies recurrent ear or sinus infections.  Denies recurrent abscesses.  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  She does not remember ever having a bronchoscopy.  Follows with Pulmonary. uses flutter valve QID, assist vest, and mucinex.    - CT scan: 08/29/20-Spectrum of pulmonary parenchymal findings compatible with chronic atypical mycobacterial infection (MAI) with widespread moderate bronchiectasis, scattered mucoid impaction and tree-in-bud opacities. Findings have mildly progressed since 12/16/2017 chest CT.  2. Aortic Atherosclerosis (ICD10-I70.0)  - 2018 total IgE 182, negative QuantiFERON gold, nondetectable Aspergillus galactomannan, negative hypersensitivity pneumonitis screen  - several sputum cultures positive for Staph aureus, last in 2018, repeat cultures in 2019 showed normal throat flora  - immune labs: protective tetanus and strep titers, inadequate diptheria titer, normal quants, normal T/B/NK enumerations, AEC 100,  - Hx of drug rash from clarithromycin/azithromcyin  Today presents for follow-up. She has chronic cough secondary to her bronchiectasis.  She does try to control cough with water and sugarless gum.  Nonetheless, continues to have coughing spells.   She has been evaluated by ENT and was told her vocal cords do not align exactly, She clears her throat often to talk.  She does have flutter valve and vibrating vest which she uses most days.  She did receive her diptheria vaccine 6 weeks ago and would like to get labs drawn today to complete her immune work-up.  She is using her  nasal spray and antihistamines every day.  She switch to the nondrowsy, and, and is using melatonin at night to sleep.  She has cut out  mammalian meat from her diet.  She did have an accidental exposures-forgot she was supposed to avoid.  She ate 2 meatballs and ham roll up.  She woke up around 2 or 3 AM sweating terribly, but did not have any further symptoms. She has avoided since.  She does have an EpiPen and carries it with her.  Has not needed   Allergies as of 10/22/2021       Reactions   Alendronate Sodium    Other reaction(s): chronic cough / reflux   Diphenhydramine Other (See Comments)   Clarithromycin Rash   Severe rash over entire body        Medication List        Accurate as of October 22, 2021 12:46 PM. If you have any questions, ask your nurse or doctor.          amLODipine 5 MG tablet Commonly known as: NORVASC Take 5 mg by mouth daily.   benzonatate 100 MG capsule Commonly known as: TESSALON Take 1 capsule by mouth as needed.   CALCIUM PLUS VITAMIN D PO Take 1 tablet by mouth 2 (two) times daily.   denosumab 60 MG/ML Sosy injection Commonly known as: PROLIA Inject 60 mg into the skin every 6 (six) months.   diphenhydrAMINE 25 MG tablet Commonly known as: SOMINEX Take 25 mg by mouth at bedtime as needed for sleep.   ibuprofen 200 MG tablet Commonly known as: ADVIL Take 200 mg by mouth. Taking '400mg'$  3x daily   meclizine 25 MG tablet Commonly known as: ANTIVERT Take 25 mg by mouth 3 (three) times daily as needed for dizziness.   multivitamin capsule Take 1 capsule by mouth daily.   omeprazole 40 MG capsule Commonly known as: PRILOSEC Take 1 capsule by mouth daily.   SUMAtriptan 100 MG tablet Commonly known as: IMITREX Take 100 mg by mouth as needed for migraine. May repeat in 2 hours if headache persists or recurs.       Past Medical History:  Diagnosis Date   Chronic migraine    GERD (gastroesophageal reflux disease)    Insomnia    Osteoporosis    Pneumonia, organism unspecified(486) 12-25-11   Vertigo, benign positional    Past Surgical History:  Procedure  Laterality Date   ADENOIDECTOMY     CATARACT EXTRACTION, BILATERAL     cells removed     IRRIGATION AND DEBRIDEMENT SEBACEOUS CYST     on scalp/ 2 cysts  removed   LAPAROSCOPIC APPENDECTOMY N/A 09/23/2012   Procedure: APPENDECTOMY LAPAROSCOPIC;  Surgeon: Zenovia Jarred, MD;  Location: [facility];  Service: General;  Laterality: N/A;   TONSILLECTOMY     TONSILLECTOMY AND ADENOIDECTOMY     Otherwise, there have been no changes to her past medical history, surgical history, family history, or social history.  ROS: All others negative except as noted per HPI.   Objective:  BP 138/68 (BP Location: Left Arm, Patient Position: Sitting, Cuff Size: Normal)   Pulse 72   Temp 97.8 F (36.6 C)   Ht 5' 6.54" (1.69 m)   Wt 140 lb 6.4 oz (63.7 kg)   SpO2 98%   BMI 22.30 kg/m  Body mass index is 22.3 kg/m. Physical Exam: General Appearance:  Alert, cooperative, no distress, appears stated age  Head:  Normocephalic, without obvious abnormality, atraumatic  Eyes:  Conjunctiva clear, EOM's intact  Nose: Nares normal, normal mucosa and no visible anterior polyps  Throat: Lips, tongue normal; teeth and gums normal, normal posterior oropharynx and + cobblestoning  Neck: Supple, symmetrical  Lungs:   Scant inspiratory wheeze, no crackles , Respirations unlabored, intermittent dry coughing  Heart:  regular rate and rhythm and no murmur, Appears well perfused  Extremities: No edema  Skin: Skin color, texture, turgor normal, no rashes or lesions on visualized portions of skin  Neurologic: No gross deficits  Spirometry:  Tracings reviewed. Her effort:  coughing throughout which affected results FVC: 2.76L FEV1: 1.19L, 59% predicted FEV1/FVC ratio: 56% Interpretation: Spirometry consistent with moderate obstructive disease.  Please see scanned spirometry results for details.  Assessment/Plan   Alpha gal allergy Hx of hives occurring in middle of night on 2 separate occasions, alpha-gal testing  positive; no hives since avoiding-unclear if cause of hives due to alpha-gal ,but recent exposure to mammalian meat yielding middle of night sweating; discussed strict avoidance and retest in 6 months or so - please strictly avoid mammalian meat (pork, beef, lamb, etc) - for SKIN only reaction, okay to take Benadryl 2 capsules every 4 hours - for SKIN + ANY additional symptoms, OR IF concern for LIFE THREATENING reaction = Epipen Autoinjector EpiPen 0.3 mg. - If using Epinephrine autoinjector, call 911 - A food allergy action plan has been provided and discussed. - Medic Alert identification is recommended. - can retest early 2024  Allergic rhinitis/post nasal drainage - Continue avoidance of molds, dust mites, dog dander, mixed feathers, horse dander - Continue Nasacort (triamcinolone or Nasonex (mometasone) or Flonase Sensimist (fluticasone)-2 sprays in each nostril once daily, point tip towards ear lobe when spraying -Consider taking a nonsedating antihistamine daily as needed  - Your options include: Zyrtec (cetirizine) 10 mg, Claritin (loratadine) 10 mg, Xyzal (levocetirizine) 5 mg or Allegra (fexofenadine) 180 mg daily as needed  Bronchiectasis: Recurrent pneumonias, history of Mycobacterium avium (MAI)  - overall reassuring immune evaluation with exception of inadequate diptheria response - continue follow-up with Pulmonary and use all devices as prescribed - could consider genetic testing for immunodeficiencies if you are interested  Clarithryomycin/azithromycin allergy: delayed drug eruption - strict avoidance  Follow-up in 6 months, sooner if needed. It was a pleasure seeing you again in clinic today  Sigurd Sos, MD  [facility]

## 2021-10-22 ENCOUNTER — Ambulatory Visit (INDEPENDENT_AMBULATORY_CARE_PROVIDER_SITE_OTHER): Payer: Medicare Other | Admitting: Internal Medicine

## 2021-10-22 ENCOUNTER — Encounter: Payer: Self-pay | Admitting: Internal Medicine

## 2021-10-22 VITALS — BP 138/68 | HR 72 | Temp 97.8°F | Ht 66.54 in | Wt 140.4 lb

## 2021-10-22 DIAGNOSIS — J470 Bronchiectasis with acute lower respiratory infection: Secondary | ICD-10-CM

## 2021-10-22 DIAGNOSIS — Z91018 Allergy to other foods: Secondary | ICD-10-CM

## 2021-10-22 DIAGNOSIS — D806 Antibody deficiency with near-normal immunoglobulins or with hyperimmunoglobulinemia: Secondary | ICD-10-CM | POA: Diagnosis not present

## 2021-10-22 DIAGNOSIS — J3089 Other allergic rhinitis: Secondary | ICD-10-CM | POA: Diagnosis not present

## 2021-10-22 DIAGNOSIS — Z23 Encounter for immunization: Secondary | ICD-10-CM | POA: Diagnosis not present

## 2021-10-22 DIAGNOSIS — Z130 Encounter for screening for diseases of the blood and blood-forming organs and certain disorders involving the immune mechanism: Secondary | ICD-10-CM | POA: Diagnosis not present

## 2021-10-22 DIAGNOSIS — R053 Chronic cough: Secondary | ICD-10-CM | POA: Diagnosis not present

## 2021-10-22 DIAGNOSIS — J302 Other seasonal allergic rhinitis: Secondary | ICD-10-CM

## 2021-10-22 DIAGNOSIS — B999 Unspecified infectious disease: Secondary | ICD-10-CM | POA: Diagnosis not present

## 2021-10-22 NOTE — Patient Instructions (Addendum)
Alpha gal allergy - please strictly avoid mammalian meat (pork, beef, lamb, etc) - for SKIN only reaction, okay to take Benadryl 2 capsules every 4 hours - for SKIN + ANY additional symptoms, OR IF concern for LIFE THREATENING reaction = Epipen Autoinjector EpiPen 0.3 mg. - If using Epinephrine autoinjector, call 911 - A food allergy action plan has been provided and discussed. - Medic Alert identification is recommended.  Allergic rhinitis/post nasal drainage - Continue avoidance of molds, dust mites, dog dander, mixed feathers, horse dander - Continue Nasacort (triamcinolone or Nasonex (mometasone) or Flonase Sensimist (fluticasone)-2 sprays in each nostril once daily, point tip towards ear lobe when spraying -Consider taking a nonsedating antihistamine daily as needed  - Your options include: Zyrtec (cetirizine) 10 mg, Claritin (loratadine) 10 mg, Xyzal (levocetirizine) 5 mg or Allegra (fexofenadine) 180 mg daily as needed  Bronchiectasis: Recurrent pneumonias, history of Mycobacterium avium (MAI)  - overall reassuring immune evaluation with exception of inadequate diptheria response - continue follow-up with Pulmonary and use all devices as prescribed - could consider genetic testing for immunodeficiencies if you are interested  Clarithryomycin/azithromycin allergy: delayed drug eruption - strict avoidance  Follow-up in 6 months, sooner if needed. It was a pleasure seeing you again in clinic today  Control of Dog or Cat Allergen  Avoidance is the best way to manage a dog or cat allergy. If you have a dog or cat and are allergic to dog or cats, consider removing the dog or cat from the home. If you have a dog or cat but don't want to find it a new home, or if your family wants a pet even though someone in the household is allergic, here are some strategies that may help keep symptoms at bay:  Keep the pet out of your bedroom and restrict it to only a few rooms. Be advised that  keeping the dog or cat in only one room will not limit the allergens to that room. Don't pet, hug or kiss the dog or cat; if you do, wash your hands with soap and water. High-efficiency particulate air (HEPA) cleaners run continuously in a bedroom or living room can reduce allergen levels over time. Regular use of a high-efficiency vacuum cleaner or a central vacuum can reduce allergen levels. Giving your dog or cat a bath at least once a week can reduce airborne allergen.  DUST MITE AVOIDANCE MEASURES:  There are three main measures that need and can be taken to avoid house dust mites:  Reduce accumulation of dust in general -reduce furniture, clothing, carpeting, books, stuffed animals, especially in bedroom  Separate yourself from the dust -use pillow and mattress encasements (can be found at stores such as [employer] or online) -avoid direct exposure to air condition flow -use a HEPA filter device, especially in the bedroom; you can also use a HEPA filter vacuum cleaner -wipe dust with a moist towel instead of a dry towel or broom when cleaning  Decrease mites and/or their secretions -wash clothing and linen and stuffed animals at highest temperature possible, at least every 2 weeks -stuffed animals can also be placed in a bag and put in a freezer overnight  Despite the above measures, it is impossible to eliminate dust mites or their allergen completely from your home.  With the above measures the burden of mites in your home can be diminished, with the goal of minimizing your allergic symptoms.  Success will be reached only when implementing and using all  means together.  Control of Mold Allergen   Mold and fungi can grow on a variety of surfaces provided certain temperature and moisture conditions exist.  Outdoor molds grow on plants, decaying vegetation and soil.  The major outdoor mold, Alternaria and Cladosporium, are found in very high numbers during hot and dry  conditions.  Generally, a late Summer - Fall peak is seen for common outdoor fungal spores.  Rain will temporarily lower outdoor mold spore count, but counts rise rapidly when the rainy period ends.  The most important indoor molds are Aspergillus and Penicillium.  Dark, humid and poorly ventilated basements are ideal sites for mold growth.  The next most common sites of mold growth are the bathroom and the kitchen.  Outdoor (Seasonal) Mold Control  Use air conditioning and keep windows closed Avoid exposure to decaying vegetation. Avoid leaf raking. Avoid grain handling. Consider wearing a face mask if working in moldy areas.    Indoor (Perennial) Mold Control   Maintain humidity below 50%. Clean washable surfaces with 5% bleach solution. Remove sources e.g. contaminated carpets.   CHRONIC THROAT CLEARING-WHAT TO DO!! Causes of chronic throat clearing may include the following (among others):  Acid reflux (lanyngopharyngeal reflux) Allergies (pollens, pet dander, dust mites, etc) Non allergic rhinitis (runny nose, post nasal drainage or congestion NOT caused by allergies-can be secondary to pollutants, cold air, changes in blood vessels to the nose as we age,etc) Environmental irritants (tobacco, smoke, air pollution) Asthma If present for a long period of time, throat clearing can become a habit. We will work with you to treat any of the medical reasons for throat clearing.  Your job is to help prevent the habit, which can cause damage (redness and swelling) to your vocal cords.  It will require a conscious effort on your behalf.  Tips for prevention of throat clearing:  Instead of clearing your throat, swallow instead.   Carrying around water (or something to drink) will help you move the mucus in the right direction.  IF you have the urge to clear your throat, drink your water. If you absolutely have to clear your throat, use a non-traumatic exercise to do so.   Pant with your  mouth open saying "[place], [place], [place]" with a powerful, breathy voice. Increase water intake.  This thins secretions, making them easier to swallow. Chew baking soda gum ([employer]) which can help with swallowing, reflux, and throat clearing.  Try to chew up to three times daily.   If you experience jaw pain or headaches, decrease the amount of chewing. Suck on sugar free hard candy to help with swallowing. Have your friends and family remind you to swallow when they hear you throat clearing.  As this can be habit forming, sometimes you may not realize you are doing this.  Having someone point it out to you, will help you become more conscious of the behavior. BE PATIENT.  This will take time to resolve, and some do not see improvement until 8-12 weeks into therapy/behavior modifications.

## 2021-10-25 LAB — DIPHTHERIA / TETANUS ANTIBODY PANEL
Diphtheria Ab: 0.87 IU/mL (ref <0.10)
Tetanus Ab, IgG: 6.17 IU/mL (ref <0.10)

## 2021-10-31 ENCOUNTER — Telehealth: Payer: Self-pay | Admitting: Internal Medicine

## 2021-11-04 ENCOUNTER — Telehealth: Payer: Self-pay

## 2021-11-04 NOTE — Telephone Encounter (Signed)
I have asked Rachelle regarding this. She stated that [facility] does not have a department that could suggested Dx for patient. There could be other reasons why Medicare denied this claim.

## 2021-11-04 NOTE — Telephone Encounter (Signed)
Patient called in - DOB verified - requesting lab results. Advised of lab results and provider notation. Patient verbalized understanding, no further questions.

## 2021-11-05 NOTE — Telephone Encounter (Signed)
Hi Tweedy,  This is the other person who is dealing with a claims denial for labs.  I am not sure which labs or which diagnosis codes were used.  I am happy to remedy with any paperwork required, but without something formal from her insurance of [facility], I don't know what or where to submit.  Can we look into how to fix this?  Thanks, Junie Panning

## 2021-11-12 NOTE — Telephone Encounter (Signed)
Thank you Tweedy! I still haven't seen anything and I checked my box again today.

## 2022-01-14 DIAGNOSIS — Z0000 Encounter for general adult medical examination without abnormal findings: Secondary | ICD-10-CM | POA: Diagnosis not present

## 2022-01-14 DIAGNOSIS — D649 Anemia, unspecified: Secondary | ICD-10-CM | POA: Diagnosis not present

## 2022-01-14 DIAGNOSIS — R7989 Other specified abnormal findings of blood chemistry: Secondary | ICD-10-CM | POA: Diagnosis not present

## 2022-01-14 DIAGNOSIS — Z79899 Other long term (current) drug therapy: Secondary | ICD-10-CM | POA: Diagnosis not present

## 2022-01-14 DIAGNOSIS — M8580 Other specified disorders of bone density and structure, unspecified site: Secondary | ICD-10-CM | POA: Diagnosis not present

## 2022-02-02 DIAGNOSIS — M545 Low back pain, unspecified: Secondary | ICD-10-CM | POA: Diagnosis not present

## 2022-02-02 DIAGNOSIS — Z2239 Carrier of other specified bacterial diseases: Secondary | ICD-10-CM | POA: Diagnosis not present

## 2022-02-02 DIAGNOSIS — Z1331 Encounter for screening for depression: Secondary | ICD-10-CM | POA: Diagnosis not present

## 2022-02-02 DIAGNOSIS — M1712 Unilateral primary osteoarthritis, left knee: Secondary | ICD-10-CM | POA: Diagnosis not present

## 2022-02-02 DIAGNOSIS — R82998 Other abnormal findings in urine: Secondary | ICD-10-CM | POA: Diagnosis not present

## 2022-02-02 DIAGNOSIS — Z0000 Encounter for general adult medical examination without abnormal findings: Secondary | ICD-10-CM | POA: Diagnosis not present

## 2022-02-02 DIAGNOSIS — Z1339 Encounter for screening examination for other mental health and behavioral disorders: Secondary | ICD-10-CM | POA: Diagnosis not present

## 2022-02-02 DIAGNOSIS — G4700 Insomnia, unspecified: Secondary | ICD-10-CM | POA: Diagnosis not present

## 2022-02-02 DIAGNOSIS — J479 Bronchiectasis, uncomplicated: Secondary | ICD-10-CM | POA: Diagnosis not present

## 2022-02-02 DIAGNOSIS — M8580 Other specified disorders of bone density and structure, unspecified site: Secondary | ICD-10-CM | POA: Diagnosis not present

## 2022-02-02 DIAGNOSIS — F325 Major depressive disorder, single episode, in full remission: Secondary | ICD-10-CM | POA: Diagnosis not present

## 2022-02-02 DIAGNOSIS — G43909 Migraine, unspecified, not intractable, without status migrainosus: Secondary | ICD-10-CM | POA: Diagnosis not present

## 2022-02-11 DIAGNOSIS — Z23 Encounter for immunization: Secondary | ICD-10-CM | POA: Diagnosis not present

## 2022-02-26 ENCOUNTER — Other Ambulatory Visit: Payer: Self-pay | Admitting: Internal Medicine

## 2022-02-26 DIAGNOSIS — Z1231 Encounter for screening mammogram for malignant neoplasm of breast: Secondary | ICD-10-CM

## 2022-03-02 ENCOUNTER — Ambulatory Visit: Payer: Medicare Other

## 2022-03-02 ENCOUNTER — Ambulatory Visit (INDEPENDENT_AMBULATORY_CARE_PROVIDER_SITE_OTHER): Payer: Medicare Other | Admitting: Surgery

## 2022-03-02 ENCOUNTER — Encounter: Payer: Self-pay | Admitting: Surgery

## 2022-03-02 VITALS — BP 145/79 | HR 90 | Ht 66.0 in | Wt 140.4 lb

## 2022-03-02 DIAGNOSIS — M25561 Pain in right knee: Secondary | ICD-10-CM | POA: Diagnosis not present

## 2022-03-02 DIAGNOSIS — M1711 Unilateral primary osteoarthritis, right knee: Secondary | ICD-10-CM

## 2022-03-02 MED ORDER — BUPIVACAINE HCL 0.25 % IJ SOLN
6.0000 mL | INTRAMUSCULAR | Status: AC | PRN
  Administered 2022-03-02: 6 mL via INTRA_ARTICULAR

## 2022-03-02 MED ORDER — LIDOCAINE HCL 1 % IJ SOLN
3.0000 mL | INTRAMUSCULAR | Status: AC | PRN
  Administered 2022-03-02: 3 mL

## 2022-03-02 MED ORDER — METHYLPREDNISOLONE ACETATE 80 MG/ML IJ SUSP
80.0000 mg | INTRAMUSCULAR | Status: AC | PRN
  Administered 2022-03-02: 80 mg via INTRA_ARTICULAR

## 2022-03-02 NOTE — Progress Notes (Signed)
Office Visit Note   Patient: Shelia Cunningham           Date of Birth: May 15, 1940           MRN: [id number] Visit Date: 03/02/2022              Requested by: Ginger Organ., MD [address] [place],  [place] [address] PCP: Ginger Organ., MD   Assessment & Plan: Visit Diagnoses:  1. Acute pain of right knee   2. Unilateral primary osteoarthritis, right knee     Plan: In hopes of giving patient relief of her right knee pain I did agree to repeat cortisone injection.  Patient consent right knee was prepped with Betadine and intra-articular Marcaine/Depo-Medrol injection performed.  Tolerated well.  Patient was seen as a definitive treatment would be total knee replacement but she is wanting to exhaust all conservative measures.  We did discuss possibly trying viscosupplementation in the future if she does not have good relief from today's injection.  Follow-up with Dr. [name] in 3 months for recheck and he can see if viscosupplementation is indicated at that point.  Follow-Up Instructions: Return in about 3 months (around 06/02/2022) for With Dr. [name] for recheck right knee..   Orders:  Orders Placed This Encounter  Procedures  . XR KNEE 3 VIEW RIGHT   No orders of the defined types were placed in this encounter.     Procedures: Large Joint Inj: R knee on 03/02/2022 2:52 PM Indications: pain and joint swelling Details: 25 G 1.5 in needle, anteromedial approach Medications: 3 mL lidocaine 1 %; 6 mL bupivacaine 0.25 %; 80 mg methylPREDNISolone acetate 80 MG/ML Outcome: tolerated well, no immediate complications Consent was given by the patient. Patient was prepped and draped in the usual sterile fashion.     Clinical Data: No additional findings.   Subjective: Chief Complaint  Patient presents with  . Right Knee - Pain    HPI 81 year old white female known history of DJD right knee comes in with complaints of right knee pain.  She said previous cortisone  injections with good response.  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  Knee pain flared up over the last few weeks.  May have an aggravated when she was at a dance function.  Pain when she is ambulating, going up and down stairs and bends the knee.  Has had some swelling.  Taken ibuprofen without any improvement. Review of Systems No current complaints of cardiopulmonary GI/GU issues  Objective: Vital Signs: BP (!) 145/79   Pulse 90   Ht '5\' 6"'$  (1.676 m)   Wt 140 lb 6.4 oz (63.7 kg)   BMI 22.66 kg/m   Physical Exam HENT:     Head: Normocephalic.  Eyes:     Extraocular Movements: Extraocular movements intact.  Pulmonary:     Effort: No respiratory distress.  Musculoskeletal:     Comments: Gait is antalgic.  Right knee she does have some swelling without large effusion.  Medial greater than lateral joint line tenderness.  Positive patellofemoral crepitus.  Ligaments are stable.  Neurological:     Mental Status: She is alert and oriented to person, place, and time.  Psychiatric:        Mood and Affect: Mood normal.   Ortho Exam  Specialty Comments:  No specialty comments available.  Imaging: No results found.   PMFS History: Patient Active Problem List   Diagnosis Date Noted  . Allergy  to alpha-gal 10/22/2021  . Seasonal and perennial allergic rhinitis 08/06/2021  . Recurrent infections 08/06/2021  . Bronchiectasis with acute lower respiratory infection ([facility]) 08/06/2021  . Allergic urticaria 08/06/2021  . Chronic throat clearing 08/06/2021  . Angina pectoris syndrome ([facility])  equivalent = thorat pain with exertion  03/08/2016  . MAI (mycobacterium avium-intracellulare) ([facility])  - clinical dx only  11/09/2015  . S/P laparoscopic appendectomy 10/26/2012  . Acute appendicitis with perforation and peritoneal abscess 09/23/2012  . Pneumonia 02/23/2012  . Upper airway cough syndrome 02/11/2012   Past Medical History:  Diagnosis Date  . Chronic migraine   . GERD  (gastroesophageal reflux disease)   . Insomnia   . Osteoporosis   . Pneumonia, organism unspecified(486) 12-25-11  . Vertigo, benign positional     Family History  Problem Relation Age of Onset  . Breast cancer Mother 70  . Bladder Cancer Father 96  . Lung cancer Maternal Grandfather   . Alcoholism Son   . Allergic rhinitis Neg Hx   . Angioedema Neg Hx   . Asthma Neg Hx   . Atopy Neg Hx   . Eczema Neg Hx   . Urticaria Neg Hx   . Immunodeficiency Neg Hx     Past Surgical History:  Procedure Laterality Date  . ADENOIDECTOMY    . CATARACT EXTRACTION, BILATERAL    . cells removed    . IRRIGATION AND DEBRIDEMENT SEBACEOUS CYST     on scalp/ 2 cysts  removed  . LAPAROSCOPIC APPENDECTOMY N/A 09/23/2012   Procedure: APPENDECTOMY LAPAROSCOPIC;  Surgeon: Zenovia Jarred, MD;  Location: [facility];  Service: General;  Laterality: N/A;  . TONSILLECTOMY    . TONSILLECTOMY AND ADENOIDECTOMY     Social History   Occupational History  . Occupation: Retired Pharmacist, hospital  Tobacco Use  . Smoking status: Never    Passive exposure: Never  . Smokeless tobacco: Never  Vaping Use  . Vaping Use: Never used  Substance and Sexual Activity  . Alcohol use: Yes    Alcohol/week: 7.0 - 10.0 standard drinks of alcohol    Types: 7 - 10 Glasses of wine per week    Comment: 1-2 glasses of wine daily  . Drug use: No  . Sexual activity: Not on file

## 2022-03-18 DIAGNOSIS — H43813 Vitreous degeneration, bilateral: Secondary | ICD-10-CM | POA: Diagnosis not present

## 2022-03-18 DIAGNOSIS — H52203 Unspecified astigmatism, bilateral: Secondary | ICD-10-CM | POA: Diagnosis not present

## 2022-03-18 DIAGNOSIS — H524 Presbyopia: Secondary | ICD-10-CM | POA: Diagnosis not present

## 2022-03-23 ENCOUNTER — Other Ambulatory Visit (HOSPITAL_COMMUNITY): Payer: Self-pay

## 2022-03-24 ENCOUNTER — Encounter (HOSPITAL_COMMUNITY)
Admission: RE | Admit: 2022-03-24 | Discharge: 2022-03-24 | Disposition: A | Discharge status: Home / Self Care | Payer: Medicare Other | Source: Ambulatory Visit | Attending: Internal Medicine | Admitting: Internal Medicine

## 2022-03-24 DIAGNOSIS — M810 Age-related osteoporosis without current pathological fracture: Secondary | ICD-10-CM | POA: Insufficient documentation

## 2022-03-24 MED ORDER — DENOSUMAB 60 MG/ML ~~LOC~~ SOSY
PREFILLED_SYRINGE | SUBCUTANEOUS | Status: AC
  Filled 2022-03-24: qty 1

## 2022-03-24 MED ORDER — DENOSUMAB 60 MG/ML ~~LOC~~ SOSY
60.0000 mg | PREFILLED_SYRINGE | Freq: Once | SUBCUTANEOUS | Status: AC
  Administered 2022-03-24: 60 mg via SUBCUTANEOUS

## 2022-04-07 ENCOUNTER — Encounter: Payer: Self-pay | Admitting: Internal Medicine

## 2022-04-07 NOTE — Telephone Encounter (Signed)
Tweedy-I have written a note and placed under communications addressing all the labs we have obtained for her as well as her need for diptheria/tetanus vaccination. Let me know how this goes. How frustrating to all.

## 2022-04-07 NOTE — Telephone Encounter (Signed)
Dr. Simona Huh, I have send you a couple of emails on this patient, but thought I needed to document in her chart. Patient had blood drawn on 08-06-21 for Alpha Gal. Medicare denied and wanted different codes to support this. I called and gave them your alternative codes. Medicare still denied. Patient has been dealing with this since July. She called me back 2 weeks ago and said it had still not been resolved. I called [facility] again and they said it wasn't a coding issue, it was a Medicare issue; that patient had signed a form saying Medicare may not pay for these services. I told patient, per [facility], she could file an appeal with Medicare. She has done that. They told her that this blood work was not medically necessary. She is requesting a letter from you stating to Medicare, that this blood work was medically necessary and life threatening if she didn't find this diagnosis out.  Also, she had a tetanus shot in the office and was told that Medicare part B does not cover this as a preventative service. Which I don't understand, but just for future reference. I am going to let Beth know this as well. Thank you for your help.

## 2022-04-09 NOTE — Telephone Encounter (Signed)
Of course.  I hope this is sufficient for insurance.  I feel terrible she is having to deal with this for so long.

## 2022-04-16 ENCOUNTER — Ambulatory Visit
Admission: RE | Admit: 2022-04-16 | Discharge: 2022-04-16 | Disposition: A | Discharge status: Home / Self Care | Payer: Medicare Other | Source: Ambulatory Visit | Attending: Internal Medicine | Admitting: Internal Medicine

## 2022-04-16 DIAGNOSIS — Z1231 Encounter for screening mammogram for malignant neoplasm of breast: Secondary | ICD-10-CM

## 2022-04-29 ENCOUNTER — Ambulatory Visit: Payer: Medicare Other | Admitting: Internal Medicine

## 2022-06-02 ENCOUNTER — Ambulatory Visit (INDEPENDENT_AMBULATORY_CARE_PROVIDER_SITE_OTHER): Payer: Medicare Other | Admitting: Orthopaedic Surgery

## 2022-06-02 ENCOUNTER — Encounter: Payer: Self-pay | Admitting: Orthopaedic Surgery

## 2022-06-02 VITALS — BP 125/69 | HR 89 | Ht 66.0 in | Wt 138.0 lb

## 2022-06-02 DIAGNOSIS — M170 Bilateral primary osteoarthritis of knee: Secondary | ICD-10-CM | POA: Diagnosis not present

## 2022-06-02 NOTE — Progress Notes (Signed)
Office Visit Note   Patient: Shelia Cunningham           Date of Birth: [birth year]           MRN: [id number] Visit Date: 06/02/2022              Requested by: Ginger Organ., MD [address] [place],  [place] [address] PCP: Ginger Organ., MD   Assessment & Plan: Visit Diagnoses:  1. Bilateral primary osteoarthritis of knee     Plan: Currently patient is doing well.  When she does have problems she takes 2 Aleve and this takes care of it.  She has increased symptoms or swelling or problems walking she can return.  X-rays reviewed lumbar spine right and left knees on today's visit.  Follow-Up Instructions: Return if symptoms worsen or fail to improve.   Orders:  No orders of the defined types were placed in this encounter.  No orders of the defined types were placed in this encounter.     Procedures: No procedures performed   Clinical Data: No additional findings.   Subjective: Chief Complaint  Patient presents with   Right Knee - Follow-up   Left Knee - Follow-up    HPI 82 year old female returns post knee injection in October.  She is 1 knee got better instantly the other took about 10 days to get relief but since then she has been doing well.  At times she sometimes has some pain in her buttocks or laterally in her hips and then sometimes it is in her knee and it tends to migrate around.  She has never had any problems with pain in her feet no history of gout.  Knee x-rays have shown medial compartment arthritis with near bone-on-bone both knees.  She has not had Visco injections in the past.  Currently she is walking well without limping.  Review of Systems all o chronic cough without acute infection.  Ther systems are noncontributory.  Patient's had a history of   Objective: Vital Signs: BP 125/69   Pulse 89   Ht '5\' 6"'$  (1.676 m)   Wt 138 lb (62.6 kg)   BMI 22.27 kg/m   Physical Exam Constitutional:      Appearance: She is well-developed.  HENT:      Head: Normocephalic.     Right Ear: External ear normal.     Left Ear: External ear normal. There is no impacted cerumen.  Eyes:     Pupils: Pupils are equal, round, and reactive to light.  Neck:     Thyroid: No thyromegaly.     Trachea: No tracheal deviation.  Cardiovascular:     Rate and Rhythm: Normal rate.  Pulmonary:     Effort: Pulmonary effort is normal.  Abdominal:     Palpations: Abdomen is soft.  Musculoskeletal:     Cervical back: No rigidity.  Skin:    General: Skin is warm and dry.  Neurological:     Mental Status: She is alert and oriented to person, place, and time.  Psychiatric:        Behavior: Behavior normal.     Ortho Exam patient has negative logroll the hips mild sciatic notch tenderness lumbar curvature noted.  She is amatory without limping.  Pelvis is level.  Specialty Comments:  No specialty comments available.  Imaging: No results found.   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[facility])  - clinical dx only  11/09/2015   S/P laparoscopic appendectomy 10/26/2012   Acute appendicitis with perforation and peritoneal abscess 09/23/2012   Pneumonia 02/23/2012   Upper airway cough syndrome 02/11/2012   Past Medical History:  Diagnosis Date   Chronic migraine    GERD (gastroesophageal reflux disease)    Insomnia    Osteoporosis    Pneumonia, organism unspecified(486) 12-25-11   Vertigo, benign positional     Family History  Problem Relation Age of Onset   Breast cancer  Mother 10   Bladder Cancer Father 63   Lung cancer Maternal Grandfather    Alcoholism Son    Allergic rhinitis Neg Hx    Angioedema Neg Hx    Asthma Neg Hx    Atopy Neg Hx    Eczema Neg Hx    Urticaria Neg Hx    Immunodeficiency Neg Hx     Past Surgical History:  Procedure Laterality Date   ADENOIDECTOMY     CATARACT EXTRACTION, BILATERAL     cells removed     IRRIGATION AND DEBRIDEMENT SEBACEOUS CYST     on scalp/ 2 cysts  removed   LAPAROSCOPIC APPENDECTOMY N/A 09/23/2012   Procedure: APPENDECTOMY LAPAROSCOPIC;  Surgeon: Zenovia Jarred, MD;  Location: [facility];  Service: General;  Laterality: N/A;   TONSILLECTOMY     TONSILLECTOMY AND ADENOIDECTOMY     Social History   Occupational History   Occupation: Retired Pharmacist, hospital  Tobacco Use   Smoking status: Never    Passive exposure: Never   Smokeless tobacco: Never  Vaping Use   Vaping Use: Never used  Substance and Sexual Activity   Alcohol use: Yes    Alcohol/week: 7.0 - 10.0 standard drinks of alcohol    Types: 7 - 10 Glasses of wine per week    Comment: 1-2 glasses of wine daily   Drug use: No   Sexual activity: Not on file

## 2022-08-12 ENCOUNTER — Ambulatory Visit: Payer: Medicare Other | Admitting: Internal Medicine

## 2022-08-16 IMAGING — MG MM DIGITAL SCREENING BILAT W/ TOMO AND CAD
6 of 12 series · 6 of 36 positions shown · non-contrast
Comparison: Previous exam(s).

CLINICAL DATA: Screening.

EXAM:
DIGITAL SCREENING BILATERAL MAMMOGRAM WITH TOMOSYNTHESIS AND CAD
TECHNIQUE: Bilateral screening digital craniocaudal and mediolateral oblique
mammograms were obtained. Bilateral screening digital breast
tomosynthesis was performed. The images were evaluated with
computer-aided detection.

[R MLO synth-2D (1 of 2)]
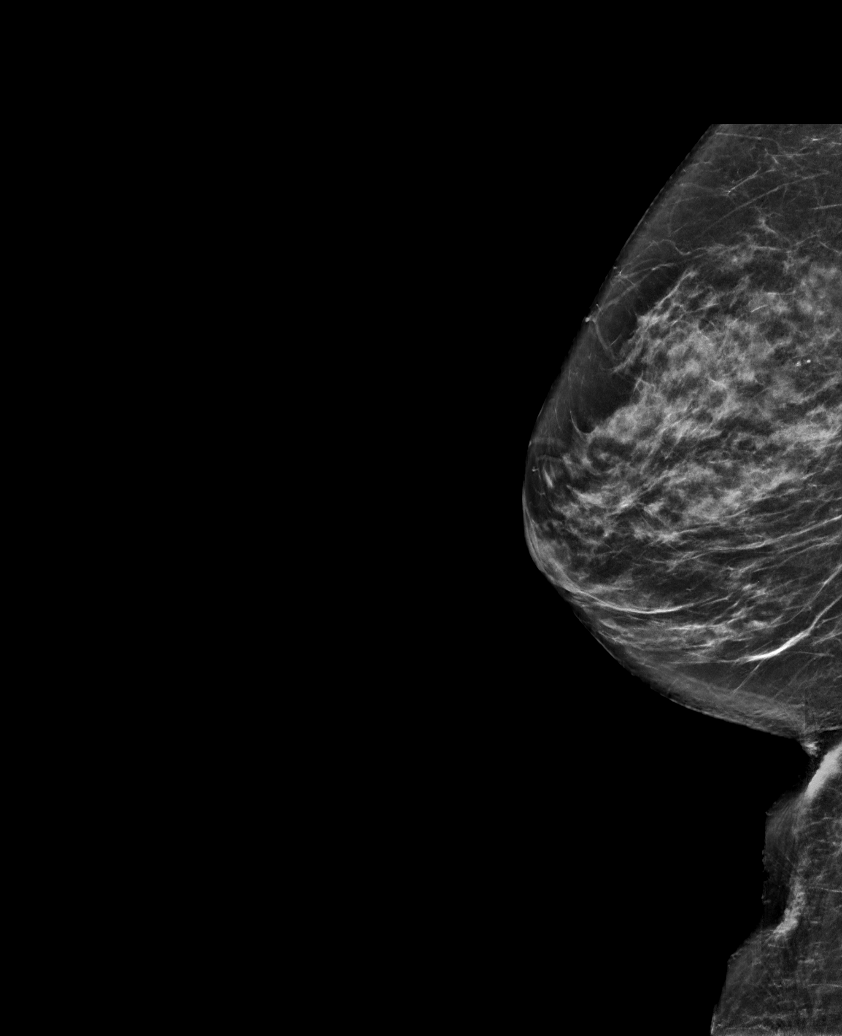

[L CC synth-2D]
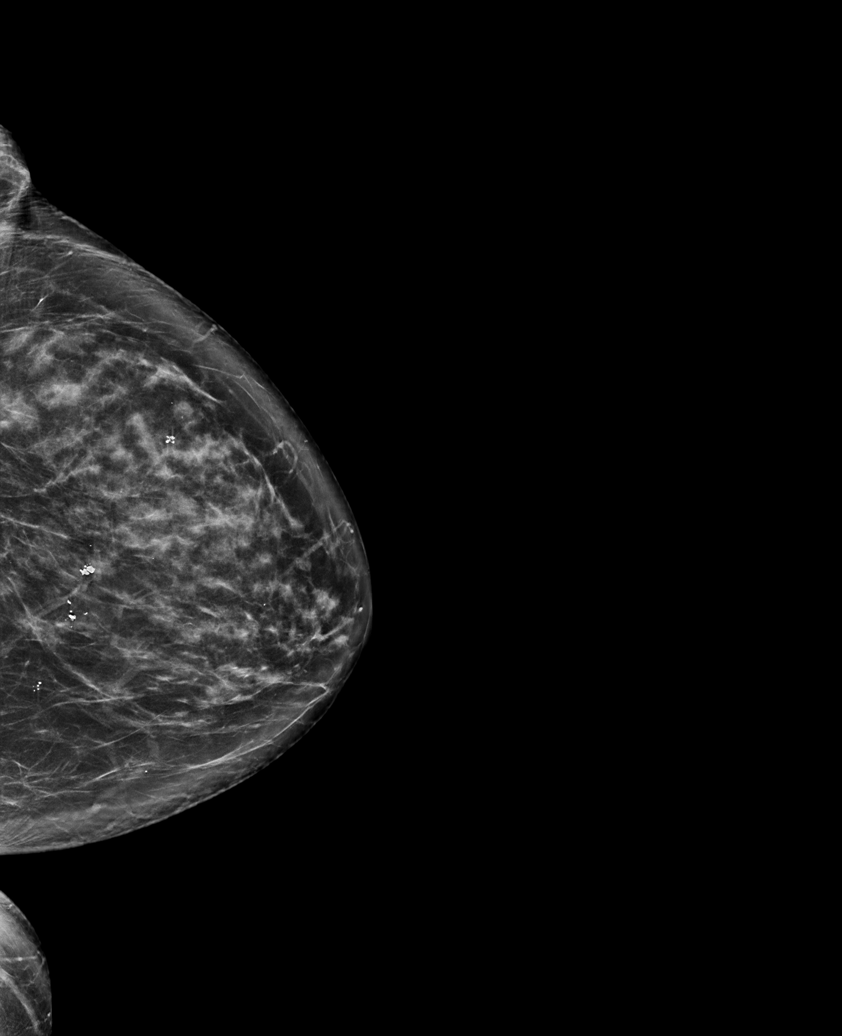

[L MLO synth-2D (1 of 2)]
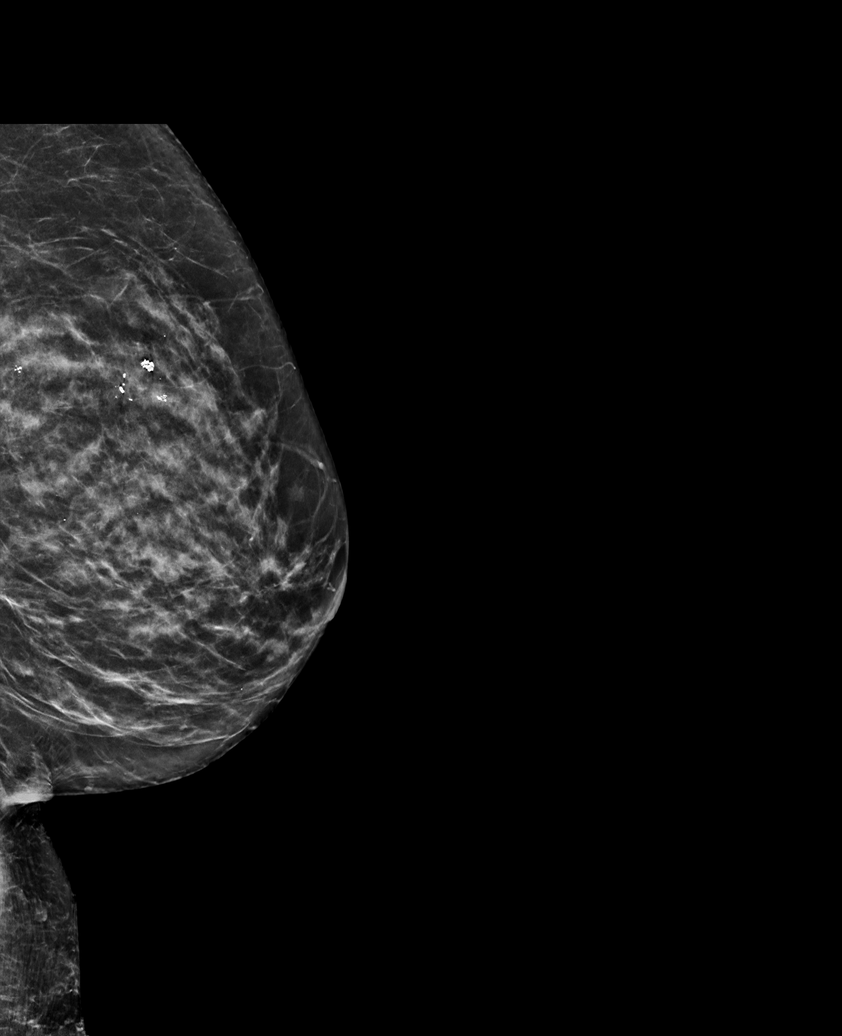

[R MLO synth-2D (2 of 2)]
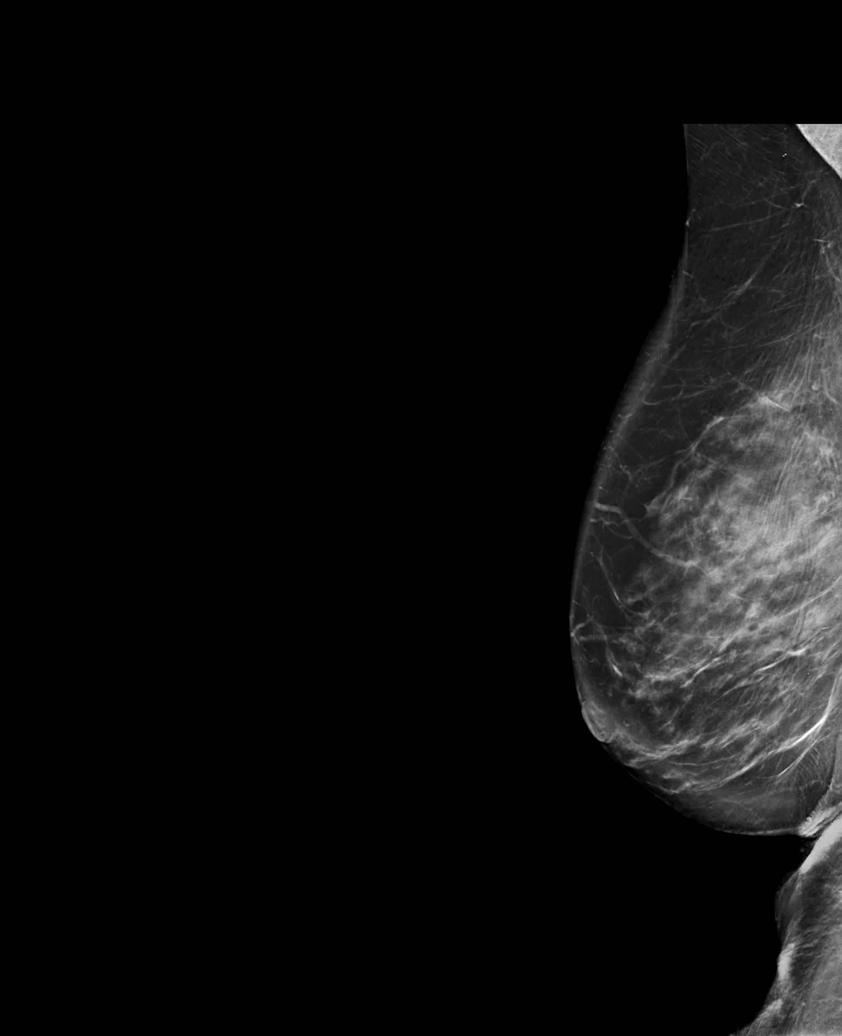

[R CC synth-2D]
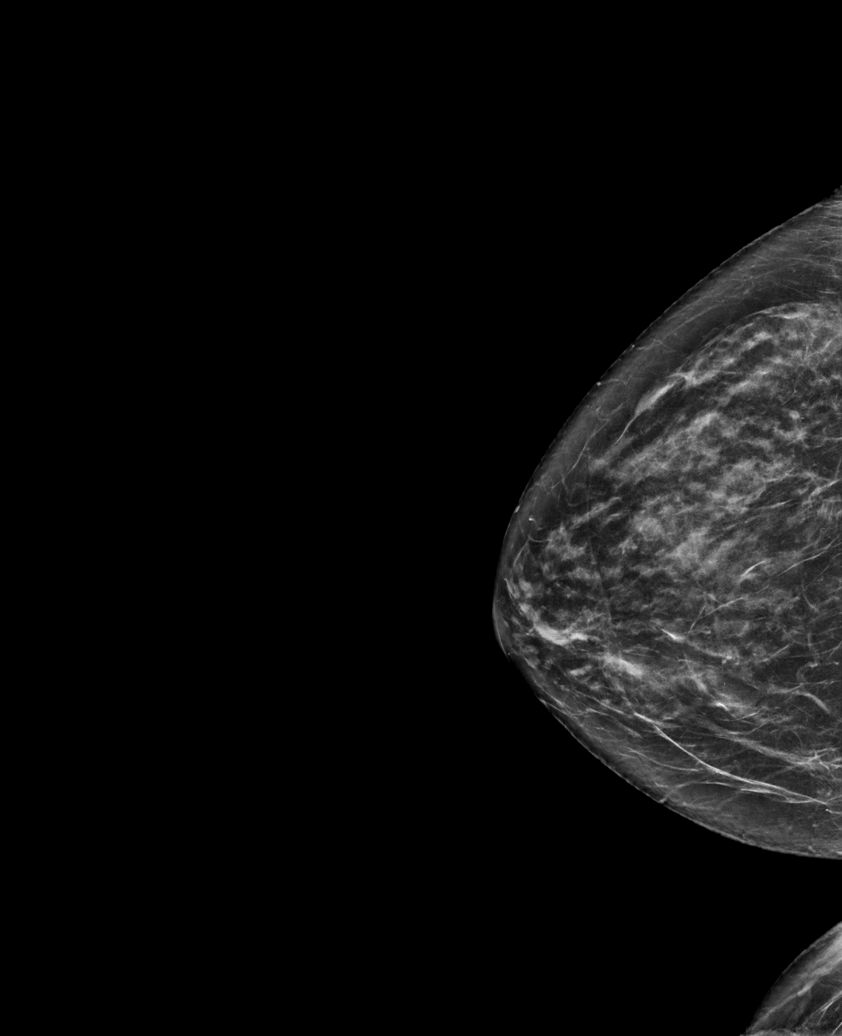

[L MLO synth-2D (2 of 2)]
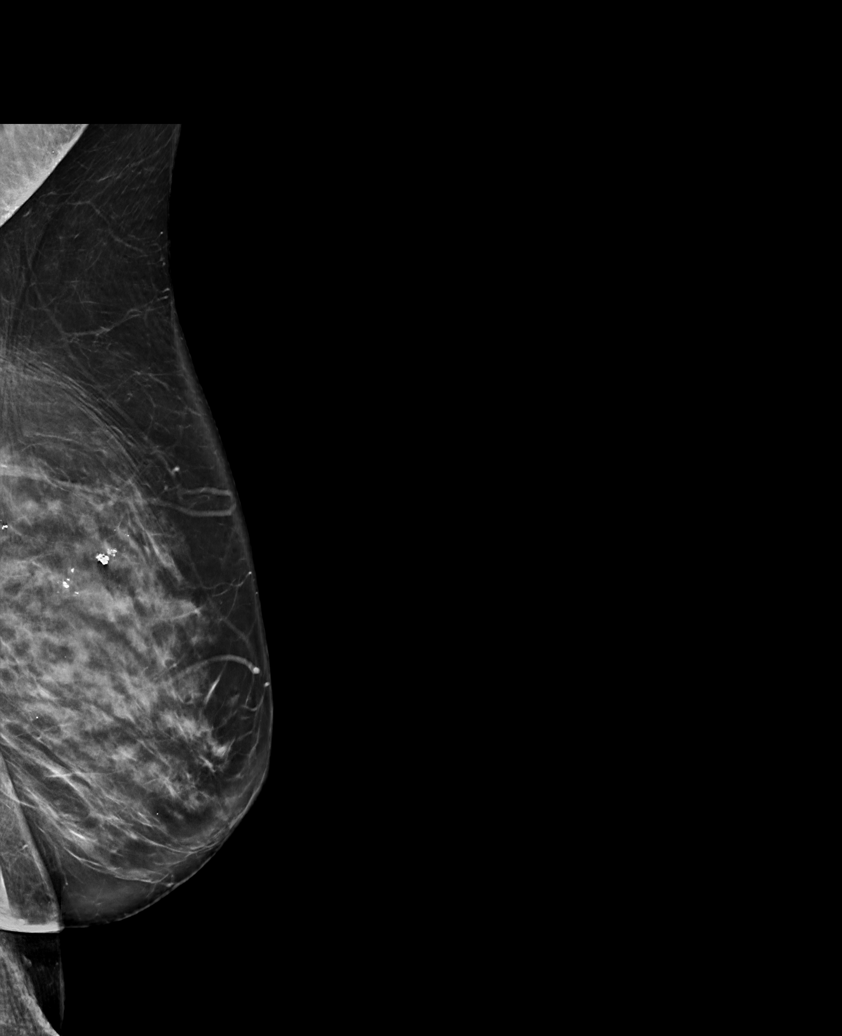

[6 of 36 positions shown; findings below may reference images not displayed]

ACR Breast Density Category c: The breast tissue is heterogeneously
dense, which may obscure small masses.
FINDINGS: There are no findings suspicious for malignancy.
IMPRESSION: No mammographic evidence of malignancy. A result letter of this
screening mammogram will be mailed directly to the patient.

RECOMMENDATION:
Screening mammogram in one year. (Code:[id number])

BI-RADS CATEGORY  1: Negative.

## 2022-09-02 DIAGNOSIS — L738 Other specified follicular disorders: Secondary | ICD-10-CM | POA: Diagnosis not present

## 2022-09-02 DIAGNOSIS — D1801 Hemangioma of skin and subcutaneous tissue: Secondary | ICD-10-CM | POA: Diagnosis not present

## 2022-09-02 DIAGNOSIS — L570 Actinic keratosis: Secondary | ICD-10-CM | POA: Diagnosis not present

## 2022-09-02 DIAGNOSIS — D485 Neoplasm of uncertain behavior of skin: Secondary | ICD-10-CM | POA: Diagnosis not present

## 2022-09-02 DIAGNOSIS — L821 Other seborrheic keratosis: Secondary | ICD-10-CM | POA: Diagnosis not present

## 2022-09-02 DIAGNOSIS — D2272 Melanocytic nevi of left lower limb, including hip: Secondary | ICD-10-CM | POA: Diagnosis not present

## 2022-09-02 DIAGNOSIS — Z85828 Personal history of other malignant neoplasm of skin: Secondary | ICD-10-CM | POA: Diagnosis not present

## 2022-09-02 DIAGNOSIS — D692 Other nonthrombocytopenic purpura: Secondary | ICD-10-CM | POA: Diagnosis not present

## 2022-09-02 DIAGNOSIS — L720 Epidermal cyst: Secondary | ICD-10-CM | POA: Diagnosis not present

## 2022-09-08 NOTE — Progress Notes (Unsigned)
FOLLOW UP Date of Service/Encounter:  09/09/22   Subjective:  Shelia Cunningham (DOB: [birth year]) is a 82 y.o. female who [facility] in re-evaluation of the following: alpha gal syndrome, history of bronchiectasis  History obtained from: chart review and patient.  For Review, LV was on 10/22/21  with Dr.Maleea Camilo seen for routine follow-up. See below for summary of history and diagnostics.  Therapeutic plans/changes recommended: Chronic cough stable; evaluated by ENT and told vocal cords didn't align perfectly. Using nasal sprays and AH. Had one accidental exposure to meatballs and woke up at 2-3 AM with diaphoresis, no other symptoms. Avoiding since.   Pertinent History/Diagnostics:  Allergic Rhinitis:  Hesitant to try Atrovent because dry mucous membranes is driving some of her chronic throat clearing.  She also has a history of migraines, so we advised against Flonase due to its irritant effects. - SPT environmental panel (08/06/21): + penicillium, bipolaris, dust mites, dog, mixed feathers, horse - 2017: respiratory allergen serum panel was negative - 2018: cat 0.28, dog 0.15, grass 0.11, timothy 0.10, otherwise negative environmental allergen History of hives:    - December 2022, she woke up in the middle of the night and felt an itching coursing through her body and was also covered in welts lasting for about 5 days.    - tryptase baseline normal (5.8), CU index 3.6, alpha gal elevated 14.90, beef 1.39, lamb 1.16 Advised avoidance of mammalian meat; epipen prescribed Bronchiectasis without asthma: managed by Pulmonary thought to stem from 3 pneumonias as an adult, starting in her 51s.  She denies recurrent ear or sinus infections.  Denies recurrent abscesses.  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  She does not remember ever having a bronchoscopy.  Follows  with Pulmonary. uses flutter valve QID, assist vest, and mucinex.   - CT scan: 08/29/20-Spectrum of pulmonary parenchymal findings compatible with chronic atypical mycobacterial infection (MAI) with widespread moderate bronchiectasis, scattered mucoid impaction and tree-in-bud opacities. Findings have mildly progressed since 12/16/2017 chest CT.  2. Aortic Atherosclerosis (ICD10-I70.0)  - 2018 total IgE 182, negative QuantiFERON gold, nondetectable Aspergillus galactomannan, negative hypersensitivity pneumonitis screen - several sputum cultures positive for Staph aureus, last in 2018, repeat cultures in 2019 showed normal throat flora  - immune labs: protective tetanus and strep titers, adequate repeat diptheria titer, normal quants, normal T/B/NK enumerations, AEC 100,  Hx of drug rash from clarithromycin/azithromcyin  Today presents for follow-up. She is currently on steroid and antibiotics due to a flare of her bronchiectasis, prescribed by pulmonary. She was coughing with emesis.  This was her only course of steroid or antibiotics this year/in the past 12 months. She has had no more exposures to mammalian meats, and has had no additional episodes of hives and/or trouble breathing.  She has been using nasacort but her nose continues to run all the time.  She was having a lot of mucus when blowing her nose.  She is not taking any second generation antihistamines. She does take a sleep aid - diphenhydramine at night, increased to 50 mg nightly.  Going to go to [place] this fall with her daughter for 10 days and is looking forward to it!  Allergies as of 09/09/2022       Reactions   Alendronate Sodium    Other reaction(s): chronic cough / reflux   Alpha-gal Hives   Diphenhydramine Other (See Comments)   Clarithromycin  Rash   Severe rash over entire body        Medication List        Accurate as of Sep 09, 2022  1:12 PM. If you have any questions, ask your nurse or doctor.           STOP taking these medications    omeprazole 40 MG capsule Commonly known as: PRILOSEC Stopped by: Verlee Monte, MD       TAKE these medications    amLODipine 5 MG tablet Commonly known as: NORVASC Take 5 mg by mouth daily.   benzonatate 100 MG capsule Commonly known as: TESSALON Take 1 capsule by mouth as needed.   CALCIUM PLUS VITAMIN D PO Take 1 tablet by mouth 2 (two) times daily.   denosumab 60 MG/ML Sosy injection Commonly known as: PROLIA Inject 60 mg into the skin every 6 (six) months.   diphenhydrAMINE 25 MG tablet Commonly known as: SOMINEX Take 25 mg by mouth at bedtime as needed for sleep.   ibuprofen 200 MG tablet Commonly known as: ADVIL Take 200 mg by mouth. Taking 400mg  3x daily   levofloxacin 750 MG tablet Commonly known as: LEVAQUIN Take 750 mg by mouth every other day.   meclizine 25 MG tablet Commonly known as: ANTIVERT Take 25 mg by mouth 3 (three) times daily as needed for dizziness.   multivitamin capsule Take 1 capsule by mouth daily.   predniSONE 10 MG tablet Commonly known as: DELTASONE Take 10 mg by mouth as directed.   SUMAtriptan 100 MG tablet Commonly known as: IMITREX Take 100 mg by mouth as needed for migraine. May repeat in 2 hours if headache persists or recurs.       Past Medical History:  Diagnosis Date   Chronic migraine    GERD (gastroesophageal reflux disease)    Insomnia    Osteoporosis    Pneumonia, organism unspecified([facility]) 12-25-11   Vertigo, benign positional    Past Surgical History:  Procedure Laterality Date   ADENOIDECTOMY     CATARACT EXTRACTION, BILATERAL     cells removed     IRRIGATION AND DEBRIDEMENT SEBACEOUS CYST     on scalp/ 2 cysts  removed   LAPAROSCOPIC APPENDECTOMY N/A 09/23/2012   Procedure: APPENDECTOMY LAPAROSCOPIC;  Surgeon: Liz Malady, MD;  Location: [facility];  Service: General;  Laterality: N/A;   TONSILLECTOMY     TONSILLECTOMY AND ADENOIDECTOMY     Otherwise, there  have been no changes to her past medical history, surgical history, family history, or social history.  ROS: All others negative except as noted per HPI.   Objective:  BP 114/68 (BP Location: Right Arm, Patient Position: Sitting, Cuff Size: Normal)   Pulse 94   Temp (!) 96.3 F (35.7 C) (Temporal)   Resp 18   Ht 5\' 6"  (1.676 m)   Wt 138 lb 1.6 oz (62.6 kg)   SpO2 93%   BMI 22.29 kg/m  Body mass index is 22.29 kg/m. Physical Exam: General Appearance:  Alert, cooperative, no distress, appears stated age  Head:  Normocephalic, without obvious abnormality, atraumatic  Eyes:  Conjunctiva clear, EOM's intact  Nose: Nares normal, hypertrophic turbinates, normal mucosa, and no visible anterior polyps  Throat: Lips, tongue normal; teeth and gums normal, normal posterior oropharynx and + cobblestoning  Neck: Supple, symmetrical  Lungs:   clear to auscultation bilaterally, Respirations unlabored, no coughing  Heart:  regular rate and rhythm and no murmur, Appears well perfused  Extremities:  No edema  Skin: Skin color, texture, turgor normal and no rashes or lesions on visualized portions of skin  Neurologic: No gross deficits    Assessment/Plan   Alpha gal allergy - please strictly avoid mammalian meat (pork, beef, lamb, etc) - for SKIN only reaction, okay to take Benadryl 2 capsules every 4 hours - for SKIN + ANY additional symptoms, OR IF concern for LIFE THREATENING reaction = Epipen Autoinjector EpiPen 0.3 mg. - If using Epinephrine autoinjector, call 911 - A food allergy action plan has been provided and discussed. - Medic Alert identification is recommended.  Allergic rhinitis/post nasal drainage - Continue avoidance of molds, dust mites, dog dander, mixed feathers, horse dander - Continue Nasacort (triamcinolone or Nasonex (mometasone) or Flonase Sensimist (fluticasone)-2 sprays in each nostril once daily, point tip towards ear lobe when spraying -Consider taking a nonsedating  antihistamine daily as needed  - Your options include: Zyrtec (cetirizine) 10 mg, Claritin (loratadine) 10 mg, Xyzal (levocetirizine) 5 mg or Allegra (fexofenadine) 180 mg daily as needed - start Astepro (azelastine) 1-2 sprays up to twice daily for ongoing congestion/runny nose.   Did recommend talking to PCP about sleep issues and avoidance of first generation antihistamines for sleep as this has been linked to possible dementia.  Bronchiectasis: Recurrent pneumonias, history of Mycobacterium avium (MAI)  - reassuring immune evaluation - continue follow-up with Pulmonary and use all devices as prescribed  Clarithryomycin/azithromycin allergy: delayed drug eruption - strict avoidance  Follow-up in 12 months, sooner if needed. It was a pleasure seeing you again in clinic today  Tonny Bollman, MD  [facility]

## 2022-09-09 ENCOUNTER — Other Ambulatory Visit: Payer: Self-pay

## 2022-09-09 ENCOUNTER — Encounter: Payer: Self-pay | Admitting: Internal Medicine

## 2022-09-09 ENCOUNTER — Ambulatory Visit (INDEPENDENT_AMBULATORY_CARE_PROVIDER_SITE_OTHER): Payer: Medicare Other | Admitting: Internal Medicine

## 2022-09-09 VITALS — BP 114/68 | HR 94 | Temp 96.3°F | Resp 18 | Ht 66.0 in | Wt 138.1 lb

## 2022-09-09 DIAGNOSIS — B999 Unspecified infectious disease: Secondary | ICD-10-CM | POA: Diagnosis not present

## 2022-09-09 DIAGNOSIS — J3089 Other allergic rhinitis: Secondary | ICD-10-CM

## 2022-09-09 DIAGNOSIS — J470 Bronchiectasis with acute lower respiratory infection: Secondary | ICD-10-CM

## 2022-09-09 DIAGNOSIS — Z91018 Allergy to other foods: Secondary | ICD-10-CM

## 2022-09-09 DIAGNOSIS — J302 Other seasonal allergic rhinitis: Secondary | ICD-10-CM

## 2022-09-09 MED ORDER — EPINEPHRINE 0.3 MG/0.3ML IJ SOAJ: 0.3000 mg | INTRAMUSCULAR | 2 refills | Status: DC | PRN

## 2022-09-09 MED ORDER — AZELASTINE HCL 0.1 % NA SOLN: 2.0000 | Freq: Two times a day (BID) | NASAL | 11 refills | Status: AC | PRN

## 2022-09-09 NOTE — Patient Instructions (Addendum)
Alpha gal allergy - please strictly avoid mammalian meat (pork, beef, lamb, etc) - for SKIN only reaction, okay to take Benadryl 2 capsules every 4 hours - for SKIN + ANY additional symptoms, OR IF concern for LIFE THREATENING reaction = Epipen Autoinjector EpiPen 0.3 mg. - If using Epinephrine autoinjector, call 911 - A food allergy action plan has been provided and discussed. - Medic Alert identification is recommended.  Allergic rhinitis/post nasal drainage - Continue avoidance of molds, dust mites, dog dander, mixed feathers, horse dander - Continue Nasacort (triamcinolone or Nasonex (mometasone) or Flonase Sensimist (fluticasone)-2 sprays in each nostril once daily, point tip towards ear lobe when spraying -Consider taking a nonsedating antihistamine daily as needed  - Your options include: Zyrtec (cetirizine) 10 mg, Claritin (loratadine) 10 mg, Xyzal (levocetirizine) 5 mg or Allegra (fexofenadine) 180 mg daily as needed - start Astepro (azelastine) 1-2 sprays up to twice daily for ongoing congestion/runny nose.   Bronchiectasis: Recurrent pneumonias, history of Mycobacterium avium (MAI)  - reassuring immune evaluation - continue follow-up with Pulmonary and use all devices as prescribed  Clarithryomycin/azithromycin allergy: delayed drug eruption - strict avoidance  Follow-up in 12 months, sooner if needed. It was a pleasure seeing you again in clinic today  Control of Dog or Cat Allergen  Avoidance is the best way to manage a dog or cat allergy. If you have a dog or cat and are allergic to dog or cats, consider removing the dog or cat from the home. If you have a dog or cat but don't want to find it a new home, or if your family wants a pet even though someone in the household is allergic, here are some strategies that may help keep symptoms at bay:  Keep the pet out of your bedroom and restrict it to only a few rooms. Be advised that keeping the dog or cat in only one room will  not limit the allergens to that room. Don't pet, hug or kiss the dog or cat; if you do, wash your hands with soap and water. High-efficiency particulate air (HEPA) cleaners run continuously in a bedroom or living room can reduce allergen levels over time. Regular use of a high-efficiency vacuum cleaner or a central vacuum can reduce allergen levels. Giving your dog or cat a bath at least once a week can reduce airborne allergen.  DUST MITE AVOIDANCE MEASURES:  There are three main measures that need and can be taken to avoid house dust mites:  Reduce accumulation of dust in general -reduce furniture, clothing, carpeting, books, stuffed animals, especially in bedroom  Separate yourself from the dust -use pillow and mattress encasements (can be found at stores such as [employer] or online) -avoid direct exposure to air condition flow -use a HEPA filter device, especially in the bedroom; you can also use a HEPA filter vacuum cleaner -wipe dust with a moist towel instead of a dry towel or broom when cleaning  Decrease mites and/or their secretions -wash clothing and linen and stuffed animals at highest temperature possible, at least every 2 weeks -stuffed animals can also be placed in a bag and put in a freezer overnight  Despite the above measures, it is impossible to eliminate dust mites or their allergen completely from your home.  With the above measures the burden of mites in your home can be diminished, with the goal of minimizing your allergic symptoms.  Success will be reached only when implementing and using all means together.  Control of Mold Allergen   Mold and fungi can grow on a variety of surfaces provided certain temperature and moisture conditions exist.  Outdoor molds grow on plants, decaying vegetation and soil.  The major outdoor mold, Alternaria and Cladosporium, are found in very high numbers during hot and dry conditions.  Generally, a late Summer - Fall peak  is seen for common outdoor fungal spores.  Rain will temporarily lower outdoor mold spore count, but counts rise rapidly when the rainy period ends.  The most important indoor molds are Aspergillus and Penicillium.  Dark, humid and poorly ventilated basements are ideal sites for mold growth.  The next most common sites of mold growth are the bathroom and the kitchen.  Outdoor (Seasonal) Mold Control  Use air conditioning and keep windows closed Avoid exposure to decaying vegetation. Avoid leaf raking. Avoid grain handling. Consider wearing a face mask if working in moldy areas.    Indoor (Perennial) Mold Control   Maintain humidity below 50%. Clean washable surfaces with 5% bleach solution. Remove sources e.g. contaminated carpets.   CHRONIC THROAT CLEARING-WHAT TO DO!! Causes of chronic throat clearing may include the following (among others):  Acid reflux (lanyngopharyngeal reflux) Allergies (pollens, pet dander, dust mites, etc) Non allergic rhinitis (runny nose, post nasal drainage or congestion NOT caused by allergies-can be secondary to pollutants, cold air, changes in blood vessels to the nose as we age,etc) Environmental irritants (tobacco, smoke, air pollution) Asthma If present for a long period of time, throat clearing can become a habit. We will work with you to treat any of the medical reasons for throat clearing.  Your job is to help prevent the habit, which can cause damage (redness and swelling) to your vocal cords.  It will require a conscious effort on your behalf.  Tips for prevention of throat clearing:  Instead of clearing your throat, swallow instead.   Carrying around water (or something to drink) will help you move the mucus in the right direction.  IF you have the urge to clear your throat, drink your water. If you absolutely have to clear your throat, use a non-traumatic exercise to do so.   Pant with your mouth open saying "[place], [place], [place]" with a powerful,  breathy voice. Increase water intake.  This thins secretions, making them easier to swallow. Chew baking soda gum ([employer]) which can help with swallowing, reflux, and throat clearing.  Try to chew up to three times daily.   If you experience jaw pain or headaches, decrease the amount of chewing. Suck on sugar free hard candy to help with swallowing. Have your friends and family remind you to swallow when they hear you throat clearing.  As this can be habit forming, sometimes you may not realize you are doing this.  Having someone point it out to you, will help you become more conscious of the behavior. BE PATIENT.  This will take time to resolve, and some do not see improvement until 8-12 weeks into therapy/behavior modifications.

## 2022-09-21 DIAGNOSIS — J470 Bronchiectasis with acute lower respiratory infection: Secondary | ICD-10-CM | POA: Diagnosis not present

## 2022-09-21 DIAGNOSIS — R053 Chronic cough: Secondary | ICD-10-CM | POA: Diagnosis not present

## 2022-12-03 ENCOUNTER — Other Ambulatory Visit (HOSPITAL_COMMUNITY): Payer: Self-pay | Admitting: *Deleted

## 2022-12-07 ENCOUNTER — Ambulatory Visit (HOSPITAL_COMMUNITY)
Admission: RE | Admit: 2022-12-07 | Discharge: 2022-12-07 | Disposition: A | Discharge status: Home / Self Care | Payer: Medicare Other | Source: Ambulatory Visit | Attending: Internal Medicine | Admitting: Internal Medicine

## 2022-12-07 DIAGNOSIS — M810 Age-related osteoporosis without current pathological fracture: Secondary | ICD-10-CM | POA: Diagnosis not present

## 2022-12-07 MED ORDER — DENOSUMAB 60 MG/ML ~~LOC~~ SOSY
PREFILLED_SYRINGE | SUBCUTANEOUS | Status: AC
  Filled 2022-12-07: qty 1

## 2022-12-07 MED ORDER — DENOSUMAB 60 MG/ML ~~LOC~~ SOSY
60.0000 mg | PREFILLED_SYRINGE | Freq: Once | SUBCUTANEOUS | Status: AC
  Administered 2022-12-07: 60 mg via SUBCUTANEOUS

## 2023-01-19 DIAGNOSIS — Z23 Encounter for immunization: Secondary | ICD-10-CM | POA: Diagnosis not present

## 2023-02-16 DIAGNOSIS — M8580 Other specified disorders of bone density and structure, unspecified site: Secondary | ICD-10-CM | POA: Diagnosis not present

## 2023-02-16 DIAGNOSIS — Z1389 Encounter for screening for other disorder: Secondary | ICD-10-CM | POA: Diagnosis not present

## 2023-02-24 DIAGNOSIS — Z2239 Carrier of other specified bacterial diseases: Secondary | ICD-10-CM | POA: Diagnosis not present

## 2023-02-24 DIAGNOSIS — M7062 Trochanteric bursitis, left hip: Secondary | ICD-10-CM | POA: Diagnosis not present

## 2023-02-24 DIAGNOSIS — Z0000 Encounter for general adult medical examination without abnormal findings: Secondary | ICD-10-CM | POA: Diagnosis not present

## 2023-02-24 DIAGNOSIS — Z23 Encounter for immunization: Secondary | ICD-10-CM | POA: Diagnosis not present

## 2023-02-24 DIAGNOSIS — R82998 Other abnormal findings in urine: Secondary | ICD-10-CM | POA: Diagnosis not present

## 2023-02-24 DIAGNOSIS — I8393 Asymptomatic varicose veins of bilateral lower extremities: Secondary | ICD-10-CM | POA: Diagnosis not present

## 2023-02-24 DIAGNOSIS — F325 Major depressive disorder, single episode, in full remission: Secondary | ICD-10-CM | POA: Diagnosis not present

## 2023-02-24 DIAGNOSIS — Z1339 Encounter for screening examination for other mental health and behavioral disorders: Secondary | ICD-10-CM | POA: Diagnosis not present

## 2023-02-24 DIAGNOSIS — M1712 Unilateral primary osteoarthritis, left knee: Secondary | ICD-10-CM | POA: Diagnosis not present

## 2023-02-24 DIAGNOSIS — J479 Bronchiectasis, uncomplicated: Secondary | ICD-10-CM | POA: Diagnosis not present

## 2023-02-24 DIAGNOSIS — M8580 Other specified disorders of bone density and structure, unspecified site: Secondary | ICD-10-CM | POA: Diagnosis not present

## 2023-02-24 DIAGNOSIS — G43909 Migraine, unspecified, not intractable, without status migrainosus: Secondary | ICD-10-CM | POA: Diagnosis not present

## 2023-02-24 DIAGNOSIS — G4700 Insomnia, unspecified: Secondary | ICD-10-CM | POA: Diagnosis not present

## 2023-02-24 DIAGNOSIS — M419 Scoliosis, unspecified: Secondary | ICD-10-CM | POA: Diagnosis not present

## 2023-02-24 DIAGNOSIS — Z91018 Allergy to other foods: Secondary | ICD-10-CM | POA: Diagnosis not present

## 2023-02-24 DIAGNOSIS — Z1331 Encounter for screening for depression: Secondary | ICD-10-CM | POA: Diagnosis not present

## 2023-03-02 ENCOUNTER — Ambulatory Visit (INDEPENDENT_AMBULATORY_CARE_PROVIDER_SITE_OTHER): Payer: Medicare Other | Admitting: Orthopaedic Surgery

## 2023-03-02 ENCOUNTER — Encounter: Payer: Self-pay | Admitting: Orthopaedic Surgery

## 2023-03-02 VITALS — BP 122/67 | HR 75 | Ht 65.5 in | Wt 139.0 lb

## 2023-03-02 DIAGNOSIS — M7062 Trochanteric bursitis, left hip: Secondary | ICD-10-CM | POA: Diagnosis not present

## 2023-03-03 DIAGNOSIS — M7062 Trochanteric bursitis, left hip: Secondary | ICD-10-CM

## 2023-03-03 MED ORDER — METHYLPREDNISOLONE ACETATE 40 MG/ML IJ SUSP
40.0000 mg | INTRAMUSCULAR | Status: AC | PRN
Start: 2023-03-03 — End: 2023-03-03
  Administered 2023-03-03: 40 mg via INTRA_ARTICULAR

## 2023-03-03 MED ORDER — LIDOCAINE HCL 1 % IJ SOLN
1.0000 mL | INTRAMUSCULAR | Status: AC | PRN
Start: 2023-03-03 — End: 2023-03-03
  Administered 2023-03-03: 1 mL

## 2023-03-03 MED ORDER — BUPIVACAINE HCL 0.25 % IJ SOLN
2.0000 mL | INTRAMUSCULAR | Status: AC | PRN
Start: 2023-03-03 — End: 2023-03-03
  Administered 2023-03-03: 2 mL via INTRA_ARTICULAR

## 2023-03-03 NOTE — Progress Notes (Signed)
Office Visit Note   Patient: Shelia Cunningham           Date of Birth: 1941/01/09           MRN: [id number] Visit Date: 03/02/2023              Requested by: Cleatis Polka., MD [address] [place],  [place] [address] PCP: Cleatis Polka., MD   Assessment & Plan: Visit Diagnoses:  1. Trochanteric bursitis, left hip     Plan: left troch injection performed with improvement in her pain. No exam findings to suggest hip OA. Return if she has continued symptoms.   Follow-Up Instructions: No follow-ups on file.   Orders:  No orders of the defined types were placed in this encounter.  No orders of the defined types were placed in this encounter.     Procedures: Large Joint Inj on 03/03/2023 4:18 PM Details: lateral approach Medications: 1 mL lidocaine 1 %; 2 mL bupivacaine 0.25 %; 40 mg methylPREDNISolone acetate 40 MG/ML      Clinical Data: No additional findings.   Subjective: Chief Complaint  Patient presents with   Left Hip - Pain    HPI 82 year old female diagnosed with hip bursitis by her PCP 2 months ago with persistent lateral left hip pain she denies any back discomfort and numbness in her foot.  She has trouble sleeping on that left side.  She has known chronic bronchiectasis denies groin pain.   Review of Systems all other systems noncontributory to HPI   Objective: Vital Signs: BP 122/67   Pulse 75   Ht 5' 5.5" (1.664 m)   Wt 139 lb (63 kg)   BMI 22.78 kg/m   Physical Exam Constitutional:      Appearance: She is well-developed.  HENT:     Head: Normocephalic.     Right Ear: External ear normal.     Left Ear: External ear normal. There is no impacted cerumen.  Eyes:     Pupils: Pupils are equal, round, and reactive to light.  Neck:     Thyroid: No thyromegaly.     Trachea: No tracheal deviation.  Cardiovascular:     Rate and Rhythm: Normal rate.  Pulmonary:     Effort: Pulmonary effort is normal.  Abdominal:     Palpations:  Abdomen is soft.  Musculoskeletal:     Cervical back: No rigidity.  Skin:    General: Skin is warm and dry.  Neurological:     Mental Status: She is alert and oriented to person, place, and time.  Psychiatric:        Behavior: Behavior normal.     Ortho Examneg logroll hips , neg SLR, no sciatic notch tenderness.    Moderate to severe left troch tenderness  Specialty Comments:  No specialty comments available.  Imaging: No results found.   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  11/09/2015   S/P laparoscopic appendectomy 10/26/2012   Acute appendicitis with  perforation and peritoneal abscess 09/23/2012   Pneumonia 02/23/2012   Upper airway cough syndrome 02/11/2012   Past Medical History:  Diagnosis Date   Chronic migraine    GERD (gastroesophageal reflux disease)    Insomnia    Osteoporosis    Pneumonia, organism unspecified(486) 12-25-11   Vertigo, benign positional     Family History  Problem Relation Age of Onset   Breast cancer Mother 1   Bladder Cancer Father 79   Lung cancer Maternal Grandfather    Alcoholism Son    Allergic rhinitis Neg Hx    Angioedema Neg Hx    Asthma Neg Hx    Atopy Neg Hx    Eczema Neg Hx    Urticaria Neg Hx    Immunodeficiency Neg Hx     Past Surgical History:  Procedure Laterality Date   ADENOIDECTOMY     CATARACT EXTRACTION, BILATERAL     cells removed     IRRIGATION AND DEBRIDEMENT  SEBACEOUS CYST     on scalp/ 2 cysts  removed   LAPAROSCOPIC APPENDECTOMY N/A 09/23/2012   Procedure: APPENDECTOMY LAPAROSCOPIC;  Surgeon: Liz Malady, MD;  Location: [facility] OR;  Service: General;  Laterality: N/A;   TONSILLECTOMY     TONSILLECTOMY AND ADENOIDECTOMY     Social History   Occupational History   Occupation: Retired Runner, broadcasting/film/video  Tobacco Use   Smoking status: Never    Passive exposure: Never   Smokeless tobacco: Never  Vaping Use   Vaping status: Never Used  Substance and Sexual Activity   Alcohol use: Yes    Alcohol/week: 7.0 - 10.0 standard drinks of alcohol    Types: 7 - 10 Glasses of wine per week    Comment: 1-2 glasses of wine daily   Drug use: No   Sexual activity: Not on file

## 2023-03-18 ENCOUNTER — Other Ambulatory Visit: Payer: Self-pay | Admitting: Internal Medicine

## 2023-03-18 DIAGNOSIS — Z1231 Encounter for screening mammogram for malignant neoplasm of breast: Secondary | ICD-10-CM

## 2023-03-24 DIAGNOSIS — H43813 Vitreous degeneration, bilateral: Secondary | ICD-10-CM | POA: Diagnosis not present

## 2023-03-24 DIAGNOSIS — Z961 Presence of intraocular lens: Secondary | ICD-10-CM | POA: Diagnosis not present

## 2023-04-22 ENCOUNTER — Ambulatory Visit
Admission: RE | Admit: 2023-04-22 | Discharge: 2023-04-22 | Disposition: A | Discharge status: Home / Self Care | Payer: Medicare Other | Source: Ambulatory Visit | Attending: Internal Medicine | Admitting: Internal Medicine

## 2023-04-22 DIAGNOSIS — Z1231 Encounter for screening mammogram for malignant neoplasm of breast: Secondary | ICD-10-CM | POA: Diagnosis not present

## 2023-06-09 ENCOUNTER — Other Ambulatory Visit (INDEPENDENT_AMBULATORY_CARE_PROVIDER_SITE_OTHER): Payer: Medicare Other

## 2023-06-09 ENCOUNTER — Ambulatory Visit (INDEPENDENT_AMBULATORY_CARE_PROVIDER_SITE_OTHER): Payer: Medicare Other | Admitting: Physician Assistant

## 2023-06-09 ENCOUNTER — Encounter: Payer: Self-pay | Admitting: Physician Assistant

## 2023-06-09 DIAGNOSIS — M25562 Pain in left knee: Secondary | ICD-10-CM

## 2023-06-09 DIAGNOSIS — G8929 Other chronic pain: Secondary | ICD-10-CM | POA: Diagnosis not present

## 2023-06-09 NOTE — Progress Notes (Signed)
Office Visit Note   Patient: Shelia Cunningham           Date of Birth: November 07, 1940           MRN: [id number] Visit Date: 06/09/2023              Requested by: Cleatis Polka., MD [address] [place],  [place] [address] PCP: Cleatis Polka., MD  Chief Complaint  Patient presents with   Left Knee - Pain      HPI: Patient is a pleasant 83 year old woman who is a patient of Dr. Ophelia Charter.  She comes in today complaining of left knee pain.  She enjoys ballroom dancing.  She said on Monday she was getting off the floor after doing some exercise and got immediate pain and swelling and difficulty bearing weight in the left knee.  She has had previous steroid injections for arthritis.  She has been taking 2 ibuprofen as needed as it is flared up.  She says it is much better today.  Assessment & Plan: Visit Diagnoses:  1. Chronic pain of left knee     Plan: Left knee pain she does have degenerative changes of all 3 compartments.  Could have had a degenerative meniscus tear.  We discussed doing a steroid injection but she thought she because she is doing so much better should forego this for now may follow-up as needed  Follow-Up Instructions: No follow-ups on file.   Ortho Exam  Patient is alert, oriented, no adenopathy, well-dressed, normal affect, normal respiratory effort. Left knee no effusion no erythema compartments are soft and compressible she is neurovascularly intact.  She is fully able to straighten and bend her knee today.  Negative Denna Haggard' sign  Imaging: No results found. No images are attached to the encounter.  Labs: No results found for: "HGBA1C", "ESRSEDRATE", "CRP", "LABURIC", "REPTSTATUS", "GRAMSTAIN", "CULT", "LABORGA"   Lab Results  Component Value Date   ALBUMIN 3.3 (L) 09/23/2012    No results found for: "MG" No results found for: "VD25OH"  No results found for: "PREALBUMIN"    Latest Ref Rng & Units 08/06/2021   11:02 AM 07/26/2015   11:42 AM  09/29/2012    5:00 AM  CBC EXTENDED  WBC 3.4 - 10.8 x10E3/uL 5.1  7.8  12.6   RBC 3.77 - 5.28 x10E6/uL 4.31  4.54  3.76   Hemoglobin 11.1 - 15.9 g/dL 19.1  47.8  29.5   HCT 34.0 - 46.6 % 40.5  40.8  33.3   Platelets 150 - 450 x10E3/uL 221  235.0  439   NEUT# 1.4 - 7.0 x10E3/uL 2.8  4.9    Lymph# 0.7 - 3.1 x10E3/uL 1.8  2.1       There is no height or weight on file to calculate BMI.  Orders:  Orders Placed This Encounter  Procedures   XR KNEE 3 VIEW LEFT   No orders of the defined types were placed in this encounter.    Procedures: No procedures performed  Clinical Data: No additional findings.  ROS:  All other systems negative, except as noted in the HPI. Review of Systems  Objective: Vital Signs: There were no vitals taken for this visit.  Specialty Comments:  No specialty comments available.  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  11/09/2015   S/P laparoscopic appendectomy 10/26/2012   Acute appendicitis with perforation and peritoneal abscess 09/23/2012   Pneumonia 02/23/2012   Upper airway cough syndrome 02/11/2012   Past Medical History:  Diagnosis Date   Chronic migraine    GERD (gastroesophageal reflux disease)    Insomnia    Osteoporosis    Pneumonia, organism unspecified(486) 12-25-11   Vertigo, benign positional     Family History  Problem Relation Age of Onset   Breast cancer Mother 72   Bladder Cancer  Father 78   Lung cancer Maternal Grandfather    Alcoholism Son    Allergic rhinitis Neg Hx    Angioedema Neg Hx    Asthma Neg Hx    Atopy Neg Hx    Eczema Neg Hx    Urticaria Neg Hx    Immunodeficiency Neg Hx     Past Surgical History:  Procedure Laterality Date   ADENOIDECTOMY     CATARACT EXTRACTION, BILATERAL     cells removed     IRRIGATION AND DEBRIDEMENT SEBACEOUS CYST     on scalp/ 2 cysts  removed   LAPAROSCOPIC APPENDECTOMY N/A 09/23/2012   Procedure: APPENDECTOMY LAPAROSCOPIC;  Surgeon: Liz Malady, MD;  Location: [facility] OR;  Service: General;  Laterality: N/A;   TONSILLECTOMY     TONSILLECTOMY AND ADENOIDECTOMY     Social History   Occupational History   Occupation: Retired Runner, broadcasting/film/video  Tobacco Use   Smoking status: Never    Passive exposure: Never   Smokeless tobacco: Never  Vaping Use   Vaping status: Never Used  Substance and Sexual Activity   Alcohol use: Yes    Alcohol/week: 7.0 - 10.0 standard drinks of alcohol    Types: 7 - 10 Glasses of wine per week    Comment: 1-2 glasses of wine daily   Drug use: No   Sexual activity: Not on file

## 2023-06-11 ENCOUNTER — Ambulatory Visit (INDEPENDENT_AMBULATORY_CARE_PROVIDER_SITE_OTHER): Payer: Medicare Other | Admitting: Physician Assistant

## 2023-06-11 ENCOUNTER — Encounter: Payer: Self-pay | Admitting: Physician Assistant

## 2023-06-11 DIAGNOSIS — M1712 Unilateral primary osteoarthritis, left knee: Secondary | ICD-10-CM

## 2023-06-11 DIAGNOSIS — M170 Bilateral primary osteoarthritis of knee: Secondary | ICD-10-CM

## 2023-06-11 MED ORDER — LIDOCAINE HCL 1 % IJ SOLN
3.0000 mL | INTRAMUSCULAR | Status: AC | PRN
Start: 2023-06-11 — End: 2023-06-11
  Administered 2023-06-11: 3 mL

## 2023-06-11 MED ORDER — METHYLPREDNISOLONE ACETATE 40 MG/ML IJ SUSP
40.0000 mg | INTRAMUSCULAR | Status: AC | PRN
Start: 2023-06-11 — End: 2023-06-11
  Administered 2023-06-11: 40 mg via INTRA_ARTICULAR

## 2023-06-11 NOTE — Progress Notes (Signed)
Office Visit Note   Patient: Shelia Cunningham           Date of Birth: 11-06-1940           MRN: [id number] Visit Date: 06/11/2023              Requested by: Cleatis Polka., MD [address] [place],  [place] [address] PCP: Cleatis Polka., MD  Chief Complaint  Patient presents with  . Left Knee - Pain, Follow-up      HPI: Patient is a pleasant 83 year old woman who I saw a couple days ago for arthritis in her left knee.  Symptoms have begun little bit worse she now would like to consider an injection.  Assessment & Plan: Visit Diagnoses: Osteoarthritis left knee  Plan: Micah Flesher forward with an injection today may follow-up as needed  Follow-Up Instructions: No follow-ups on file.   Ortho Exam  Patient is alert, oriented, no adenopathy, well-dressed, normal affect, normal respiratory effort. Left knee no effusion no erythema compartments are soft and compressible she is neurovascular intact  Imaging: No results found. No images are attached to the encounter.  Labs: No results found for: "HGBA1C", "ESRSEDRATE", "CRP", "LABURIC", "REPTSTATUS", "GRAMSTAIN", "CULT", "LABORGA"   Lab Results  Component Value Date   ALBUMIN 3.3 (L) 09/23/2012    No results found for: "MG" No results found for: "VD25OH"  No results found for: "PREALBUMIN"    Latest Ref Rng & Units 08/06/2021   11:02 AM 07/26/2015   11:42 AM 09/29/2012    5:00 AM  CBC EXTENDED  WBC 3.4 - 10.8 x10E3/uL 5.1  7.8  12.6   RBC 3.77 - 5.28 x10E6/uL 4.31  4.54  3.76   Hemoglobin 11.1 - 15.9 g/dL 19.1  47.8  29.5   HCT 34.0 - 46.6 % 40.5  40.8  33.3   Platelets 150 - 450 x10E3/uL 221  235.0  439   NEUT# 1.4 - 7.0 x10E3/uL 2.8  4.9    Lymph# 0.7 - 3.1 x10E3/uL 1.8  2.1       There is no height or weight on file to calculate BMI.  Orders:  No orders of the defined types were placed in this encounter.  No orders of the defined types were placed in this encounter.    Procedures: Large Joint  Inj: L knee on 06/11/2023 2:02 PM Indications: pain and diagnostic evaluation Details: 22 G 1.5 in needle, anteromedial approach  Arthrogram: No  Medications: 40 mg methylPREDNISolone acetate 40 MG/ML; 3 mL lidocaine 1 % Outcome: tolerated well, no immediate complications Procedure, treatment alternatives, risks and benefits explained, specific risks discussed. Consent was given by the patient.    Clinical Data: No additional findings.  ROS:  All other systems negative, except as noted in the HPI. Review of Systems  Objective: Vital Signs: There were no vitals taken for this visit.  Specialty Comments:  No specialty comments available.  PMFS History: Patient Active Problem List   Diagnosis Date Noted  . Trochanteric bursitis, left hip 03/03/2023  . Bilateral primary osteoarthritis of knee 06/02/2022  . Allergy to alpha-gal 10/22/2021  . Seasonal and perennial allergic rhinitis 08/06/2021  . Recurrent infections 08/06/2021  . Bronchiectasis with acute lower respiratory infection (HCC) 08/06/2021  . Allergic urticaria 08/06/2021  . Chronic throat clearing 08/06/2021  . Angina pectoris syndrome (HCC)  equivalent = thorat pain with exertion  03/08/2016  . 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  S/P laparoscopic appendectomy 10/26/2012  . Acute appendicitis with perforation and peritoneal abscess 09/23/2012  . Pneumonia 02/23/2012  . Upper airway cough syndrome 02/11/2012   Past Medical History:  Diagnosis Date  . Chronic migraine   . GERD (gastroesophageal reflux disease)   . Insomnia   . Osteoporosis   . Pneumonia, organism unspecified(486) 12-25-11  . Vertigo, benign positional     Family History  Problem Relation Age of Onset  . Breast cancer Mother 106  . Bladder Cancer Father 84  . Lung cancer Maternal Grandfather   . Alcoholism Son   . Allergic rhinitis Neg Hx   . Angioedema Neg Hx   . Asthma Neg Hx   . Atopy Neg Hx    . Eczema Neg Hx   . Urticaria Neg Hx   . Immunodeficiency Neg Hx     Past Surgical History:  Procedure Laterality Date  . ADENOIDECTOMY    . CATARACT EXTRACTION, BILATERAL    . cells removed    . IRRIGATION AND DEBRIDEMENT SEBACEOUS CYST     on scalp/ 2 cysts  removed  . LAPAROSCOPIC APPENDECTOMY N/A 09/23/2012   Procedure: APPENDECTOMY LAPAROSCOPIC;  Surgeon: Liz Malady, MD;  Location: [facility] OR;  Service: General;  Laterality: N/A;  . TONSILLECTOMY    . TONSILLECTOMY AND ADENOIDECTOMY     Social History   Occupational History  . Occupation: Retired Runner, broadcasting/film/video  Tobacco Use  . Smoking status: Never    Passive exposure: Never  . Smokeless tobacco: Never  Vaping Use  . Vaping status: Never Used  Substance and Sexual Activity  . Alcohol use: Yes    Alcohol/week: 7.0 - 10.0 standard drinks of alcohol    Types: 7 - 10 Glasses of wine per week    Comment: 1-2 glasses of wine daily  . Drug use: No  . Sexual activity: Not on file

## 2023-06-15 ENCOUNTER — Other Ambulatory Visit (HOSPITAL_COMMUNITY): Payer: Self-pay

## 2023-06-16 ENCOUNTER — Ambulatory Visit (HOSPITAL_COMMUNITY)
Admission: RE | Admit: 2023-06-16 | Discharge: 2023-06-16 | Disposition: A | Discharge status: Home / Self Care | Payer: Medicare Other | Source: Ambulatory Visit | Attending: Internal Medicine | Admitting: Internal Medicine

## 2023-06-16 DIAGNOSIS — M810 Age-related osteoporosis without current pathological fracture: Secondary | ICD-10-CM | POA: Insufficient documentation

## 2023-06-16 MED ORDER — DENOSUMAB 60 MG/ML ~~LOC~~ SOSY
PREFILLED_SYRINGE | SUBCUTANEOUS | Status: AC
  Filled 2023-06-16: qty 1

## 2023-06-16 MED ORDER — DENOSUMAB 60 MG/ML ~~LOC~~ SOSY
60.0000 mg | PREFILLED_SYRINGE | Freq: Once | SUBCUTANEOUS | Status: AC
  Administered 2023-06-16: 60 mg via SUBCUTANEOUS

## 2023-07-09 ENCOUNTER — Ambulatory Visit: Payer: Medicare Other | Admitting: Physician Assistant

## 2023-07-09 ENCOUNTER — Encounter: Payer: Self-pay | Admitting: Physician Assistant

## 2023-07-09 DIAGNOSIS — M7062 Trochanteric bursitis, left hip: Secondary | ICD-10-CM

## 2023-07-09 MED ORDER — MELOXICAM 7.5 MG PO TABS: 7.5000 mg | ORAL_TABLET | Freq: Every day | ORAL | 0 refills | Status: DC

## 2023-07-09 MED ORDER — METHYLPREDNISOLONE ACETATE 40 MG/ML IJ SUSP
40.0000 mg | INTRAMUSCULAR | Status: AC | PRN
Start: 2023-07-09 — End: 2023-07-09
  Administered 2023-07-09: 40 mg via INTRA_ARTICULAR

## 2023-07-09 MED ORDER — LIDOCAINE HCL 1 % IJ SOLN
3.0000 mL | INTRAMUSCULAR | Status: AC | PRN
Start: 2023-07-09 — End: 2023-07-09
  Administered 2023-07-09: 3 mL

## 2023-07-09 NOTE — Progress Notes (Signed)
 Office Visit Note   Patient: Shelia Cunningham           Date of Birth: 01/12/1941           MRN: [id number] Visit Date: 07/09/2023              Requested by: Cleatis Polka., MD [address] [place],  [place] [address] PCP: Cleatis Polka., MD  Chief Complaint  Patient presents with  . Left Knee - Edema      HPI: Shelia Cunningham follows up today for her left knee.  She has had no new injury but she still has swelling.  She thinks the injection helped a little bit.  She is wondering if there is an oral anti-inflammatory that she could take.  She has taken ibuprofen but does not seem to help a lot.  She is also requesting treatment for her trochanteric bursitis on her left hip.  She is having trouble sleeping on that side.  Denies any injury has had no fever or chills  Assessment & Plan: Visit Diagnoses:  1. Trochanteric bursitis, left hip     Plan: Discussed trying some meloxicam at a low dose instead of ibuprofen.  We also will go forward with a trochanteric bursa injection today may follow-up as needed  Follow-Up Instructions: No follow-ups on file.   Ortho Exam  Patient is alert, oriented, no adenopathy, well-dressed, normal affect, normal respiratory effort. Left knee she does have a small palpable cyst in the popliteal fossa.  Compartments are soft and nontender no erythema no effusion she does have mild swelling on the medial side of her knee she is neurovascular intact Left hip no erythema focal pain over the trochanteric bursa  Imaging: No results found. No images are attached to the encounter.  Labs: No results found for: "HGBA1C", "ESRSEDRATE", "CRP", "LABURIC", "REPTSTATUS", "GRAMSTAIN", "CULT", "LABORGA"   Lab Results  Component Value Date   ALBUMIN 3.3 (L) 09/23/2012    No results found for: "MG" No results found for: "VD25OH"  No results found for: "PREALBUMIN"    Latest Ref Rng & Units 08/06/2021   11:02 AM 07/26/2015   11:42 AM 09/29/2012    5:00 AM   CBC EXTENDED  WBC 3.4 - 10.8 x10E3/uL 5.1  7.8  12.6   RBC 3.77 - 5.28 x10E6/uL 4.31  4.54  3.76   Hemoglobin 11.1 - 15.9 g/dL 84.6  96.2  95.2   HCT 34.0 - 46.6 % 40.5  40.8  33.3   Platelets 150 - 450 x10E3/uL 221  235.0  439   NEUT# 1.4 - 7.0 x10E3/uL 2.8  4.9    Lymph# 0.7 - 3.1 x10E3/uL 1.8  2.1       There is no height or weight on file to calculate BMI.  Orders:  No orders of the defined types were placed in this encounter.  Meds ordered this encounter  Medications  . meloxicam (MOBIC) 7.5 MG tablet    Sig: Take 1 tablet (7.5 mg total) by mouth daily.    Dispense:  30 tablet    Refill:  0     Procedures: Large Joint Inj: L greater trochanter on 07/09/2023 3:54 PM Indications: pain and diagnostic evaluation Details: 25 G 1.5 in needle, lateral approach  Arthrogram: No  Medications: 3 mL lidocaine 1 %; 40 mg methylPREDNISolone acetate 40 MG/ML Outcome: tolerated well, no immediate complications Procedure, treatment alternatives, risks and benefits explained, specific risks discussed. Consent was given by the  patient.    Clinical Data: No additional findings.  ROS:  All other systems negative, except as noted in the HPI. Review of Systems  Objective: Vital Signs: There were no vitals taken for this visit.  Specialty Comments:  No specialty comments available.  PMFS History: Patient Active Problem List   Diagnosis Date Noted  . Trochanteric bursitis, left hip 03/03/2023  . Bilateral primary osteoarthritis of knee 06/02/2022  . Allergy to alpha-gal 10/22/2021  . Seasonal and perennial allergic rhinitis 08/06/2021  . Recurrent infections 08/06/2021  . Bronchiectasis with acute lower respiratory infection (HCC) 08/06/2021  . Allergic urticaria 08/06/2021  . Chronic throat clearing 08/06/2021  . Angina pectoris syndrome (HCC)  equivalent = thorat pain with exertion  03/08/2016  . 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 S/P laparoscopic appendectomy 10/26/2012  . Acute appendicitis with perforation and peritoneal abscess 09/23/2012  . Pneumonia 02/23/2012  . Upper airway cough syndrome 02/11/2012   Past Medical History:  Diagnosis Date  . Chronic migraine   . GERD (gastroesophageal reflux disease)   . Insomnia   . Osteoporosis   . Pneumonia, organism unspecified(486) 12-25-11  . Vertigo, benign positional     Family History  Problem Relation Age of Onset  . Breast cancer Mother 2  . Bladder Cancer Father 75  . Lung cancer Maternal Grandfather   . Alcoholism Son   . Allergic rhinitis Neg Hx   . Angioedema Neg Hx   . Asthma Neg Hx   . Atopy Neg Hx   . Eczema Neg Hx   . Urticaria Neg Hx   . Immunodeficiency Neg Hx     Past Surgical History:  Procedure Laterality Date  . ADENOIDECTOMY    . CATARACT EXTRACTION, BILATERAL    . cells removed    . IRRIGATION AND DEBRIDEMENT SEBACEOUS CYST     on scalp/ 2 cysts  removed  . LAPAROSCOPIC APPENDECTOMY N/A 09/23/2012   Procedure: APPENDECTOMY LAPAROSCOPIC;  Surgeon: Liz Malady, MD;  Location: [facility] OR;  Service: General;  Laterality: N/A;  . TONSILLECTOMY    . TONSILLECTOMY AND ADENOIDECTOMY     Social History   Occupational History  . Occupation: Retired Runner, broadcasting/film/video  Tobacco Use  . Smoking status: Never    Passive exposure: Never  . Smokeless tobacco: Never  Vaping Use  . Vaping status: Never Used  Substance and Sexual Activity  . Alcohol use: Yes    Alcohol/week: 7.0 - 10.0 standard drinks of alcohol    Types: 7 - 10 Glasses of wine per week    Comment: 1-2 glasses of wine daily  . Drug use: No  . Sexual activity: Not on file

## 2023-08-02 ENCOUNTER — Other Ambulatory Visit: Payer: Self-pay | Admitting: Physician Assistant

## 2023-08-31 ENCOUNTER — Other Ambulatory Visit: Payer: Self-pay | Admitting: Physician Assistant

## 2023-09-08 ENCOUNTER — Ambulatory Visit: Payer: Medicare Other | Admitting: Internal Medicine

## 2023-09-09 DIAGNOSIS — G43909 Migraine, unspecified, not intractable, without status migrainosus: Secondary | ICD-10-CM | POA: Diagnosis not present

## 2023-09-09 DIAGNOSIS — Z2239 Carrier of other specified bacterial diseases: Secondary | ICD-10-CM | POA: Diagnosis not present

## 2023-09-09 DIAGNOSIS — M26609 Unspecified temporomandibular joint disorder, unspecified side: Secondary | ICD-10-CM | POA: Diagnosis not present

## 2023-09-09 DIAGNOSIS — J479 Bronchiectasis, uncomplicated: Secondary | ICD-10-CM | POA: Diagnosis not present

## 2023-09-09 DIAGNOSIS — F439 Reaction to severe stress, unspecified: Secondary | ICD-10-CM | POA: Diagnosis not present

## 2023-09-09 DIAGNOSIS — H9202 Otalgia, left ear: Secondary | ICD-10-CM | POA: Diagnosis not present

## 2023-09-11 ENCOUNTER — Other Ambulatory Visit: Payer: Self-pay

## 2023-09-11 ENCOUNTER — Encounter (HOSPITAL_BASED_OUTPATIENT_CLINIC_OR_DEPARTMENT_OTHER): Payer: Self-pay | Admitting: Emergency Medicine

## 2023-09-11 ENCOUNTER — Emergency Department (HOSPITAL_BASED_OUTPATIENT_CLINIC_OR_DEPARTMENT_OTHER)

## 2023-09-11 ENCOUNTER — Inpatient Hospital Stay (HOSPITAL_BASED_OUTPATIENT_CLINIC_OR_DEPARTMENT_OTHER)
Admission: EM | Admit: 2023-09-11 | Discharge: 2023-09-13 | DRG: 177 | Disposition: A | Discharge status: Home / Self Care | Attending: Internal Medicine | Admitting: Internal Medicine

## 2023-09-11 DIAGNOSIS — J690 Pneumonitis due to inhalation of food and vomit: Secondary | ICD-10-CM | POA: Diagnosis present

## 2023-09-11 DIAGNOSIS — J9601 Acute respiratory failure with hypoxia: Secondary | ICD-10-CM | POA: Diagnosis not present

## 2023-09-11 DIAGNOSIS — M4802 Spinal stenosis, cervical region: Secondary | ICD-10-CM | POA: Diagnosis not present

## 2023-09-11 DIAGNOSIS — R918 Other nonspecific abnormal finding of lung field: Secondary | ICD-10-CM | POA: Diagnosis not present

## 2023-09-11 DIAGNOSIS — Z7952 Long term (current) use of systemic steroids: Secondary | ICD-10-CM

## 2023-09-11 DIAGNOSIS — B999 Unspecified infectious disease: Secondary | ICD-10-CM | POA: Diagnosis present

## 2023-09-11 DIAGNOSIS — Z881 Allergy status to other antibiotic agents status: Secondary | ICD-10-CM | POA: Diagnosis not present

## 2023-09-11 DIAGNOSIS — Z801 Family history of malignant neoplasm of trachea, bronchus and lung: Secondary | ICD-10-CM | POA: Diagnosis not present

## 2023-09-11 DIAGNOSIS — Z8052 Family history of malignant neoplasm of bladder: Secondary | ICD-10-CM

## 2023-09-11 DIAGNOSIS — J168 Pneumonia due to other specified infectious organisms: Secondary | ICD-10-CM | POA: Diagnosis not present

## 2023-09-11 DIAGNOSIS — K219 Gastro-esophageal reflux disease without esophagitis: Secondary | ICD-10-CM | POA: Diagnosis present

## 2023-09-11 DIAGNOSIS — R058 Other specified cough: Secondary | ICD-10-CM | POA: Diagnosis present

## 2023-09-11 DIAGNOSIS — Z887 Allergy status to serum and vaccine status: Secondary | ICD-10-CM

## 2023-09-11 DIAGNOSIS — Z888 Allergy status to other drugs, medicaments and biological substances status: Secondary | ICD-10-CM | POA: Diagnosis not present

## 2023-09-11 DIAGNOSIS — J984 Other disorders of lung: Secondary | ICD-10-CM | POA: Diagnosis not present

## 2023-09-11 DIAGNOSIS — A310 Pulmonary mycobacterial infection: Secondary | ICD-10-CM | POA: Diagnosis not present

## 2023-09-11 DIAGNOSIS — K1120 Sialoadenitis, unspecified: Secondary | ICD-10-CM | POA: Diagnosis not present

## 2023-09-11 DIAGNOSIS — M542 Cervicalgia: Secondary | ICD-10-CM | POA: Diagnosis not present

## 2023-09-11 DIAGNOSIS — J189 Pneumonia, unspecified organism: Secondary | ICD-10-CM | POA: Diagnosis not present

## 2023-09-11 DIAGNOSIS — M47812 Spondylosis without myelopathy or radiculopathy, cervical region: Secondary | ICD-10-CM | POA: Diagnosis not present

## 2023-09-11 DIAGNOSIS — M810 Age-related osteoporosis without current pathological fracture: Secondary | ICD-10-CM | POA: Diagnosis present

## 2023-09-11 DIAGNOSIS — Z791 Long term (current) use of non-steroidal anti-inflammatories (NSAID): Secondary | ICD-10-CM | POA: Diagnosis not present

## 2023-09-11 DIAGNOSIS — I6523 Occlusion and stenosis of bilateral carotid arteries: Secondary | ICD-10-CM | POA: Diagnosis not present

## 2023-09-11 DIAGNOSIS — K1121 Acute sialoadenitis: Secondary | ICD-10-CM | POA: Diagnosis not present

## 2023-09-11 DIAGNOSIS — Z79899 Other long term (current) drug therapy: Secondary | ICD-10-CM

## 2023-09-11 DIAGNOSIS — I700 Atherosclerosis of aorta: Secondary | ICD-10-CM | POA: Diagnosis not present

## 2023-09-11 DIAGNOSIS — J470 Bronchiectasis with acute lower respiratory infection: Secondary | ICD-10-CM | POA: Diagnosis present

## 2023-09-11 DIAGNOSIS — Z803 Family history of malignant neoplasm of breast: Secondary | ICD-10-CM | POA: Diagnosis not present

## 2023-09-11 LAB — CBC WITH DIFFERENTIAL/PLATELET
Abs Immature Granulocytes: 0.05 10*3/uL (ref 0.00–0.07)
Basophils Absolute: 0 10*3/uL (ref 0.0–0.1)
Basophils Relative: 0 %
Eosinophils Absolute: 0.1 10*3/uL (ref 0.0–0.5)
Eosinophils Relative: 1 %
HCT: 31.7 % — ABNORMAL LOW (ref 36.0–46.0)
Hemoglobin: 10.3 g/dL — ABNORMAL LOW (ref 12.0–15.0)
Immature Granulocytes: 1 %
Lymphocytes Relative: 13 %
Lymphs Abs: 1.1 10*3/uL (ref 0.7–4.0)
MCH: 30.7 pg (ref 26.0–34.0)
MCHC: 32.5 g/dL (ref 30.0–36.0)
MCV: 94.3 fL (ref 80.0–100.0)
Monocytes Absolute: 0.8 10*3/uL (ref 0.1–1.0)
Monocytes Relative: 10 %
Neutro Abs: 6.8 10*3/uL (ref 1.7–7.7)
Neutrophils Relative %: 75 %
Platelets: 234 10*3/uL (ref 150–400)
RBC: 3.36 MIL/uL — ABNORMAL LOW (ref 3.87–5.11)
RDW: 14.6 % (ref 11.5–15.5)
WBC: 8.9 10*3/uL (ref 4.0–10.5)
nRBC: 0 % (ref 0.0–0.2)

## 2023-09-11 LAB — CBC
HCT: 30.7 % — ABNORMAL LOW (ref 36.0–46.0)
Hemoglobin: 9.8 g/dL — ABNORMAL LOW (ref 12.0–15.0)
MCH: 29.9 pg (ref 26.0–34.0)
MCHC: 31.9 g/dL (ref 30.0–36.0)
MCV: 93.6 fL (ref 80.0–100.0)
Platelets: 224 10*3/uL (ref 150–400)
RBC: 3.28 MIL/uL — ABNORMAL LOW (ref 3.87–5.11)
RDW: 14.4 % (ref 11.5–15.5)
WBC: 8.5 10*3/uL (ref 4.0–10.5)
nRBC: 0 % (ref 0.0–0.2)

## 2023-09-11 LAB — COMPREHENSIVE METABOLIC PANEL WITH GFR
ALT: 30 U/L (ref 0–44)
AST: 37 U/L (ref 15–41)
Albumin: 3.6 g/dL (ref 3.5–5.0)
Alkaline Phosphatase: 90 U/L (ref 38–126)
Anion gap: 8 (ref 5–15)
BUN: 12 mg/dL (ref 8–23)
CO2: 28 mmol/L (ref 22–32)
Calcium: 9.1 mg/dL (ref 8.9–10.3)
Chloride: 103 mmol/L (ref 98–111)
Creatinine, Ser: 0.59 mg/dL (ref 0.44–1.00)
GFR, Estimated: >60 mL/min (ref >60)
Glucose, Bld: 145 mg/dL — ABNORMAL HIGH (ref 70–99)
Potassium: 3.7 mmol/L (ref 3.5–5.1)
Sodium: 140 mmol/L (ref 135–145)
Total Bilirubin: 0.2 mg/dL (ref 0.0–1.2)
Total Protein: 6.4 g/dL — ABNORMAL LOW (ref 6.5–8.1)

## 2023-09-11 LAB — CREATININE, SERUM
Creatinine, Ser: 0.97 mg/dL (ref 0.44–1.00)
GFR, Estimated: 58 mL/min — ABNORMAL LOW (ref >60)

## 2023-09-11 LAB — LACTIC ACID, PLASMA: Lactic Acid, Venous: 1.6 mmol/L (ref 0.5–1.9)

## 2023-09-11 MED ORDER — SODIUM CHLORIDE 0.9 % IV SOLN
2.0000 g | INTRAVENOUS | Status: DC
  Administered 2023-09-11 – 2023-09-12 (×2): 2 g via INTRAVENOUS
  Filled 2023-09-11 (×2): qty 20

## 2023-09-11 MED ORDER — IOHEXOL 300 MG/ML  SOLN
75.0000 mL | Freq: Once | INTRAMUSCULAR | Status: AC | PRN
  Administered 2023-09-11: 75 mL via INTRAVENOUS

## 2023-09-11 MED ORDER — DEXAMETHASONE SODIUM PHOSPHATE 10 MG/ML IJ SOLN
10.0000 mg | Freq: Once | INTRAMUSCULAR | Status: AC
  Administered 2023-09-11: 10 mg via INTRAVENOUS
  Filled 2023-09-11: qty 1

## 2023-09-11 MED ORDER — ONDANSETRON HCL 4 MG/2ML IJ SOLN
4.0000 mg | Freq: Once | INTRAMUSCULAR | Status: AC
  Administered 2023-09-11: 4 mg via INTRAVENOUS
  Filled 2023-09-11: qty 2

## 2023-09-11 MED ORDER — VANCOMYCIN HCL IN DEXTROSE 1-5 GM/200ML-% IV SOLN: 1000.0000 mg | Freq: Once | INTRAVENOUS | Status: DC

## 2023-09-11 MED ORDER — LACTATED RINGERS IV BOLUS (SEPSIS)
500.0000 mL | Freq: Once | INTRAVENOUS | Status: AC
  Administered 2023-09-11: 500 mL via INTRAVENOUS

## 2023-09-11 MED ORDER — FENTANYL CITRATE PF 50 MCG/ML IJ SOSY
50.0000 ug | PREFILLED_SYRINGE | Freq: Once | INTRAMUSCULAR | Status: AC
  Administered 2023-09-11: 50 ug via INTRAVENOUS
  Filled 2023-09-11: qty 1

## 2023-09-11 MED ORDER — METRONIDAZOLE 500 MG/100ML IV SOLN
500.0000 mg | Freq: Two times a day (BID) | INTRAVENOUS | Status: DC
  Administered 2023-09-11 – 2023-09-12 (×3): 500 mg via INTRAVENOUS
  Filled 2023-09-11 (×3): qty 100

## 2023-09-11 MED ORDER — AMLODIPINE BESYLATE 5 MG PO TABS
5.0000 mg | ORAL_TABLET | Freq: Every day | ORAL | Status: DC
  Administered 2023-09-12 – 2023-09-13 (×2): 5 mg via ORAL
  Filled 2023-09-11 (×2): qty 1

## 2023-09-11 MED ORDER — VANCOMYCIN HCL 500 MG IV SOLR
500.0000 mg | Freq: Once | INTRAVENOUS | Status: AC
  Administered 2023-09-11: 500 mg via INTRAVENOUS

## 2023-09-11 MED ORDER — LACTATED RINGERS IV SOLN: INTRAVENOUS | Status: AC

## 2023-09-11 MED ORDER — ENOXAPARIN SODIUM 40 MG/0.4ML IJ SOSY
40.0000 mg | PREFILLED_SYRINGE | INTRAMUSCULAR | Status: DC
  Administered 2023-09-11: 40 mg via SUBCUTANEOUS

## 2023-09-11 MED ORDER — PIPERACILLIN-TAZOBACTAM 3.375 G IVPB 30 MIN
3.3750 g | Freq: Once | INTRAVENOUS | Status: AC
  Administered 2023-09-11: 3.375 g via INTRAVENOUS
  Filled 2023-09-11: qty 50

## 2023-09-11 MED ORDER — SODIUM CHLORIDE 0.9 % IV SOLN: 2.0000 g | Freq: Once | INTRAVENOUS | Status: DC

## 2023-09-11 MED ORDER — MORPHINE SULFATE (PF) 4 MG/ML IV SOLN
4.0000 mg | Freq: Once | INTRAVENOUS | Status: DC
  Filled 2023-09-11: qty 1

## 2023-09-11 MED ORDER — KETOROLAC TROMETHAMINE 30 MG/ML IJ SOLN
15.0000 mg | Freq: Once | INTRAMUSCULAR | Status: AC
  Administered 2023-09-11: 15 mg via INTRAVENOUS
  Filled 2023-09-11: qty 1

## 2023-09-11 MED ORDER — VANCOMYCIN HCL IN DEXTROSE 1-5 GM/200ML-% IV SOLN
1000.0000 mg | Freq: Once | INTRAVENOUS | Status: AC
  Administered 2023-09-11: 1000 mg via INTRAVENOUS
  Filled 2023-09-11: qty 200

## 2023-09-11 MED ORDER — SODIUM CHLORIDE 0.9 % IV SOLN
100.0000 mg | Freq: Two times a day (BID) | INTRAVENOUS | Status: DC
  Administered 2023-09-11 – 2023-09-12 (×3): 100 mg via INTRAVENOUS
  Filled 2023-09-11 (×4): qty 100

## 2023-09-11 MED ORDER — SODIUM CHLORIDE 0.9 % IV SOLN: INTRAVENOUS | Status: DC

## 2023-09-11 NOTE — Progress Notes (Signed)
 ED Pharmacy Antibiotic Sign Off An antibiotic consult was received from an ED provider for vancomycin and zosyn  per pharmacy dosing for pneumonia. A chart review was completed to assess appropriateness. Pt failed outpatient therapy of > 7 days of levaquin .   The following one time order(s) were placed:  Vancomycin 1500mg  x1  Zosyn  3.375mg  x1   Further antibiotic and/or antibiotic pharmacy consults should be ordered by the admitting provider if indicated.   Thank you for allowing pharmacy to be a part of this patient's care.   Mamie Searles, PharmD, BCCCP  Clinical Pharmacist 09/11/23 5:16 PM

## 2023-09-11 NOTE — ED Provider Notes (Signed)
 [facility]PARTMENT AT [facility] Provider Note   CSN: [id number] Arrival date & time: 09/11/23  1330     History  No chief complaint on file.   Shelia Cunningham is a 83 y.o. female who presents emergency department with chief complaint of left-sided ear neck and face pain.  Patient reports that she had onset of pain and swelling to the left side of her face starting 4 days ago.  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  Patient reports that her pain is 7 out of 10, waxing and waning she has pain with opening her jaw.  She states it all started as some pain in her left ear.  She denies drainage from the left ear.  She denies shaking chills or obvious fever but states that the pain has been so severe she has been unable to sleep.  She just completed a course of 15 days of Levaquin  for bronchiectasis.  HPI     Home Medications Prior to Admission medications   Medication Sig Start Date End Date Taking? Authorizing Provider  amLODipine  (NORVASC ) 5 MG tablet Take 5 mg by mouth daily.    [provider]  azelastine  (ASTELIN ) 0.1 % nasal spray Place 2 sprays into both nostrils 2 (two) times daily as needed for rhinitis. Use in each nostril as directed 09/09/22   Sean Czar, MD  benzonatate (TESSALON) 100 MG capsule Take 1 capsule by mouth as needed. 01/03/18   [provider]  Calcium  Carbonate-Vitamin D (CALCIUM  PLUS VITAMIN D PO) Take 1 tablet by mouth 2 (two) times daily.     [provider]  denosumab  (PROLIA ) 60 MG/ML SOSY injection Inject 60 mg into the skin every 6 (six) months.    [provider]  diphenhydrAMINE  (SOMINEX) 25 MG tablet Take 25 mg by mouth at bedtime as needed for sleep.    [provider]  EPINEPHrine  (EPIPEN  2-PAK) 0.3 mg/0.3 mL IJ SOAJ injection Inject 0.3 mg into the muscle as needed for anaphylaxis. 09/09/22   Sean Czar, MD  meclizine (ANTIVERT) 25 MG tablet Take 25 mg by mouth 3 (three) times daily as needed for dizziness.    [provider]  meloxicam  (MOBIC ) 7.5 MG tablet TAKE 1 TABLET BY MOUTH EVERY DAY 09/02/23   Persons, Norma Beckers, [place]  Multiple Vitamin (MULTIVITAMIN) capsule Take 1 capsule by mouth daily.    [provider]  predniSONE  (DELTASONE ) 10 MG tablet Take 10 mg by mouth as directed. 09/08/22   [provider]  SUMAtriptan  (IMITREX ) 100 MG tablet Take 100 mg by mouth as needed for migraine. May repeat in 2 hours if headache persists or recurs.    [provider]      Allergies    Alendronate sodium, Alpha-gal, Diphenhydramine , and Clarithromycin    Review of Systems   Review of Systems  Physical Exam Updated Vital Signs BP (!) 153/60 (BP Location: Right Arm)   Pulse (!) 112   Temp 98 F (36.7 C) (Oral)   Resp 18   Wt 62.6 kg   SpO2 91%   BMI 22.62 kg/m  Physical Exam Vitals and nursing note reviewed.  Constitutional:      General: She is not in acute distress.    Appearance: She is well-developed. She is not diaphoretic.  HENT:     Head: Normocephalic.     Comments: Pain and swelling  anterior to the left ear in the region of the parotid gland there is also erythema of the skin extending down to the clavicle and to the mid line of the trachea.  Erythema also extends upward into the left ear.  The skin is warm to the touch.  She has associated trismus but no pain with swallowing and no obvious signs of infection of the tonsils.  Left ear canal is erythematous without obvious drainage no pain with movement of the pinna    Right Ear: External ear normal.     Left Ear: External ear normal.     Nose: Nose normal.     Mouth/Throat:     Mouth: Mucous membranes are moist.  Eyes:     General: No scleral icterus.    Conjunctiva/sclera: Conjunctivae normal.  Cardiovascular:     Rate and Rhythm: Normal rate and regular rhythm.     Heart sounds:  Normal heart sounds. No murmur heard.    No friction rub. No gallop.  Pulmonary:     Effort: Pulmonary effort is normal. No respiratory distress.     Breath sounds: Normal breath sounds.  Abdominal:     General: Bowel sounds are normal. There is no distension.     Palpations: Abdomen is soft. There is no mass.     Tenderness: There is no abdominal tenderness. There is no guarding.  Musculoskeletal:     Cervical back: Normal range of motion.  Skin:    General: Skin is warm and dry.  Neurological:     Mental Status: She is alert and oriented to person, place, and time.  Psychiatric:        Behavior: Behavior normal.         ED Results / Procedures / Treatments   Labs (all labs ordered are listed, but only abnormal results are displayed) Labs Reviewed  CULTURE, BLOOD (ROUTINE X 2)  CULTURE, BLOOD (ROUTINE X 2)  LACTIC ACID, PLASMA  LACTIC ACID, PLASMA  COMPREHENSIVE METABOLIC PANEL WITH GFR  CBC WITH DIFFERENTIAL/PLATELET    EKG None  Radiology No results found.  Procedures .Critical Care  Performed by: Tama Fails, PA-C Authorized by: Tama Fails, PA-C   Critical care provider statement:    Critical care time (minutes):  30   Critical care time was exclusive of:  Separately billable procedures and treating other patients   Critical care was necessary to treat or prevent imminent or life-threatening deterioration of the following conditions:  Respiratory failure   Critical care was time spent personally by me on the following activities:  Development of treatment plan with patient or surrogate, discussions with consultants, evaluation of patient's response to treatment, examination of patient, ordering and review of laboratory studies, ordering and review of radiographic studies, ordering and performing treatments and interventions, pulse oximetry, re-evaluation of patient's condition and review of old charts   Care discussed with: admitting provider       Medications Ordered in ED Medications  lactated ringers  bolus 500 mL (has no administration in time range)  lactated ringers  infusion (has no administration in time range)  fentaNYL  (SUBLIMAZE ) injection 50 mcg (has no administration in time range)  ondansetron  (ZOFRAN ) injection 4 mg (has no administration in time range)    ED Course/ Medical Decision Making/ A&P Clinical Course as of 09/18/23 1825  Sat Sep 11, 2023  1618 WBC: 8.9 [AH]    Clinical Course User Index [AH] Tama Fails, PA-C  Medical Decision Making Amount and/or Complexity of Data Reviewed Labs: ordered. Decision-making details documented in ED Course. Radiology: ordered.  Risk Prescription drug management. Decision regarding hospitalization.   This patient presents to the ED with chief complaint(s) of left sided facial swelling and pain with pertinent past medical history of , patient treatment of bronchiectasis with Levaquin  status post tonsillectomy and adenoidectomy which further complicates the presenting complaint. The complaint involves an extensive differential diagnosis and treatment options and also carries with it a high risk of complications and morbidity.    The differential diagnosis includes acute parotitis, sialoadenitis, abscess.  The initial plan is to order fluids, labs, blood cultures, CT soft tissue of the neck with contrast and to treat patient with pain medications    Reassessment and review (also see workup area): Lab Tests: I Ordered, and personally interpreted labs.  The pertinent results include:   Labs unremarkable no leukocytosis or sig chemistry abnormalities  Imaging Studies: I ordered and independently visualized and interpreted the following imaging CT scan soft tissue neck and X-ray chest  which showed parotitis and pnemonia The interpretation of the imaging was limited to assessing for emergent pathology, for which purpose it was  ordered.  Consultations Obtained: I requested consultation with the admitting physician failed abx therapy and pneumonia, ent for recs on parotitis, and discussed  findings as well as pertinent plan - they recommend: admissio and ( ent recs per ed course)  Medicines ordered and prescription drug management: I ordered the following medications  Medications  lactated ringers  infusion (0 mLs Intravenous Stopped 09/11/23 2300)  lactated ringers  bolus 500 mL (0 mLs Intravenous Stopped 09/11/23 1620)  fentaNYL  (SUBLIMAZE ) injection 50 mcg (50 mcg Intravenous Given 09/11/23 1454)  ondansetron  (ZOFRAN ) injection 4 mg (4 mg Intravenous Given 09/11/23 1452)  iohexol  (OMNIPAQUE ) 300 MG/ML solution 75 mL (75 mLs Intravenous Contrast Given 09/11/23 1530)  dexamethasone  (DECADRON ) injection 10 mg (10 mg Intravenous Given 09/11/23 1728)  ketorolac  (TORADOL ) 30 MG/ML injection 15 mg (15 mg Intravenous Given 09/11/23 1725)  piperacillin -tazobactam (ZOSYN ) IVPB 3.375 g (0 g Intravenous Stopped 09/11/23 1853)  vancomycin  (VANCOCIN ) IVPB 1000 mg/200 mL premix (0 mg Intravenous Stopped 09/11/23 1845)    Followed by  vancomycin  (VANCOCIN ) 500 mg in sodium chloride  0.9 % 100 mL IVPB (500 mg Intravenous New Bag/Given 09/11/23 1845)  dexamethasone  (DECADRON ) injection 8 mg (8 mg Intravenous Given 09/13/23 0600)     for pneumonia     Reevaluation of the patient after these medicines showed that the patient    improved   Cardiac Monitoring: The patient was maintained on a cardiac monitor.  I personally viewed and interpreted the cardiac monitor which showed an underlying rhythm of:  sinus rhythm    Disposition: After consideration of the diagnostic results and the patient's response to treatment,  I feel that the patent would benefit from admission for pneumonia failed abx  therapy with hypoxia.           Final Clinical Impression(s) / ED Diagnoses Final diagnoses:  Pneumonia of both lungs due to infectious organism,  unspecified part of lung  Acute respiratory failure with hypoxia [facility])  Parotitis    Rx / DC Orders ED Discharge Orders     None         Tama Fails, PA-C 09/18/23 1833    Lowery Rue, DO 09/21/23 2338

## 2023-09-11 NOTE — ED Notes (Signed)
 This RN notified of patient's oxygen  saturations reading at 84% with good waveform. 2L oxygen  via nasal cannula applied to patient. Provider notified.

## 2023-09-11 NOTE — H&P (Signed)
 History and Physical    Patient: Shelia Cunningham ZOX:[id number] DOB: [birth year] DOA: 09/11/2023 DOS: the patient was seen and examined on 09/11/2023 PCP: Jeannine Milroy., MD  Patient coming from: Home  Chief Complaint: No chief complaint on file.  HPI: Shelia Cunningham is a 83 y.o. 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  Symptoms have been going on for 4 days.  Patient was seen by PCP and diagnosed with TMJ's dysfunction.  She was on cyclobenzaprine for that.  Pain however did not improve.  It is has gotten worse to 7 out of 10.  Is waxing and waning.  Pain is worse with any attempt to eat.  Patient was seen in the ER and evaluated.  ENT was consulted.  Suggested that patient had acute parotitis.  As patient was seen in the ER was noted to have hypoxia.  Required 2 L of oxygen  per minute.  Subsequent chest x-ray showed bilateral pneumonia left greater than right.  Patient is therefore being admitted to the hospital for further evaluation and treatment.  She is still hypoxic.  No cough.  No sick contacts.  Acute viral screen so far negative.  Review of Systems: As mentioned in the history of present illness. All other systems reviewed and are negative. Past Medical History:  Diagnosis Date   Chronic migraine    GERD (gastroesophageal reflux disease)    Insomnia    Osteoporosis    Pneumonia, organism unspecified(486) 12-25-11   Vertigo, benign positional    Past Surgical History:  Procedure Laterality Date   ADENOIDECTOMY     CATARACT EXTRACTION, BILATERAL     cells removed     IRRIGATION AND DEBRIDEMENT SEBACEOUS CYST     on scalp/ 2 cysts  removed   LAPAROSCOPIC APPENDECTOMY N/A 09/23/2012   Procedure: APPENDECTOMY LAPAROSCOPIC;  Surgeon: Cloyce Darby,  MD;  Location: [facility] OR;  Service: General;  Laterality: N/A;   TONSILLECTOMY     TONSILLECTOMY AND ADENOIDECTOMY     Social History:  reports that she has never smoked. She has never been exposed to tobacco smoke. She has never used smokeless tobacco. She reports current alcohol  use of about 7.0 - 10.0 standard drinks of alcohol  per week. She reports that she does not use drugs.  Allergies  Allergen Reactions   Alendronate Sodium     Other reaction(s): chronic cough / reflux   Alpha-Gal Hives   Diphenhydramine  Other (See Comments)   Clarithromycin Rash    Severe rash over entire body    Family History  Problem Relation Age of Onset   Breast cancer Mother 66   Bladder Cancer Father 110   Lung cancer Maternal Grandfather    Alcoholism Son    Allergic rhinitis Neg Hx    Angioedema Neg Hx    Asthma Neg Hx    Atopy Neg Hx    Eczema Neg Hx    Urticaria Neg Hx    Immunodeficiency Neg Hx     Prior to Admission medications   Medication Sig Start Date End Date Taking? Authorizing Provider  amLODipine  (NORVASC ) 5 MG tablet Take 5 mg by mouth daily.    [provider]  azelastine  (ASTELIN ) 0.1 % nasal spray Place 2 sprays into both nostrils 2 (two) times daily as needed for rhinitis. Use  in each nostril as directed 09/09/22   Sean Czar, MD  benzonatate (TESSALON) 100 MG capsule Take 1 capsule by mouth as needed. 01/03/18   [provider]  Calcium  Carbonate-Vitamin D (CALCIUM  PLUS VITAMIN D PO) Take 1 tablet by mouth 2 (two) times daily.     [provider]  denosumab  (PROLIA ) 60 MG/ML SOSY injection Inject 60 mg into the skin every 6 (six) months.    [provider]  diphenhydrAMINE  (SOMINEX) 25 MG tablet Take 25 mg by mouth at bedtime as needed for sleep.    [provider]  EPINEPHrine  (EPIPEN  2-PAK) 0.3 mg/0.3 mL IJ SOAJ injection Inject 0.3 mg into the muscle as needed for anaphylaxis. 09/09/22   Sean Czar, MD  meclizine (ANTIVERT) 25 MG  tablet Take 25 mg by mouth 3 (three) times daily as needed for dizziness.    [provider]  meloxicam  (MOBIC ) 7.5 MG tablet TAKE 1 TABLET BY MOUTH EVERY DAY 09/02/23   Persons, Norma Beckers, [place]  Multiple Vitamin (MULTIVITAMIN) capsule Take 1 capsule by mouth daily.    [provider]  predniSONE  (DELTASONE ) 10 MG tablet Take 10 mg by mouth as directed. 09/08/22   [provider]  SUMAtriptan (IMITREX) 100 MG tablet Take 100 mg by mouth as needed for migraine. May repeat in 2 hours if headache persists or recurs.    [provider]    Physical Exam: Vitals:   09/11/23 1630 09/11/23 1645 09/11/23 1743 09/11/23 2033  BP: (!) 139/52   (!) 130/47  Pulse: 88 98  84  Resp: (!) 22 18  20   Temp:   98 F (36.7 C) 98.4 F (36.9 C)  TempSrc:   Oral Oral  SpO2: 99% 100%  93%  Weight:       Constitutional: Acutely ill-looking, NAD, calm, comfortable Eyes: PERRL, lids and conjunctivae normal ENMT: Mucous membranes are moist. Posterior pharynx clear of any exudate or lesions.Normal dentition. Left face swollen, tender. Neck: normal, supple, no masses, no thyromegaly Respiratory: Decreased air entry bilaterally, no wheezing, no crackles. Normal respiratory effort. No accessory muscle use.  Cardiovascular: Regular rate and rhythm, no murmurs / rubs / gallops. No extremity edema. 2+ pedal pulses. No carotid bruits.  Abdomen: no tenderness, no masses palpated. No hepatosplenomegaly. Bowel sounds positive.  Musculoskeletal: Good range of motion, no joint swelling or tenderness, Skin: no rashes, lesions, ulcers. No induration Neurologic: CN 2-12 grossly intact. Sensation intact, DTR normal. Strength 5/5 in all 4.  Psychiatric: Normal judgment and insight. Alert and oriented x 3. Normal mood  Data Reviewed:  Temperature 98.4, blood pressure 153/60, pulse 112 respiratory of 18 respiratory rate 91% on room air , glucose 145, hemoglobin 10.3, CT soft tissue neck showed  asymmetrically diffuse inflammatory changes within the left parotid gland compatible with parotitis also secondary inflammatory changes extend into the left submandibular space patchy nodular airspace opacity in the right upper lobe superior segment of the right lower lobe compatible with pneumonia chest x-ray showed left greater than right lower lobe airspace disease favoring pneumonia or aspiration  Assessment and Plan:  #1 multifocal pneumonia: Suspect aspiration.  Blood cultures obtained.  Patient will be admitted.  Initiate IV Rocephin, doxycycline.  Will add Flagyl especially with parotitis.  Could be from dental abscess also but not diagnosed by ENT.  #2 acute parotitis: Continue the antibiotics as above.  Continue monitoring.  If no improvement will get ENT consult for evaluation.  #3 MAI infection: Patient has  just completed treatment.  Followed by pulmonary.  Defer to pulmonary for outpatient treatment.  #4 history of recurrent infections: Continue to monitor.  #5 bronchiectasis: Continue breathing treatments    Advance Care Planning:   Code Status: Full Code   Consults: None.  Possible ENT consult if not better  Family Communication: No family at bedside  Severity of Illness: The appropriate patient status for this patient is INPATIENT. Inpatient status is judged to be reasonable and necessary in order to provide the required intensity of service to ensure the patient's safety. The patient's presenting symptoms, physical exam findings, and initial radiographic and laboratory data in the context of their chronic comorbidities is felt to place them at high risk for further clinical deterioration. Furthermore, it is not anticipated that the patient will be medically stable for discharge from the hospital within 2 midnights of admission.   * I certify that at the point of admission it is my clinical judgment that the patient will require inpatient hospital care spanning beyond 2  midnights from the point of admission due to high intensity of service, high risk for further deterioration and high frequency of surveillance required.*  AuthorCarolin Chyle, MD 09/11/2023 9:06 PM  For on call review [email or web address].

## 2023-09-11 NOTE — ED Notes (Signed)
 Called Shelia Cunningham at [facility] for transport

## 2023-09-11 NOTE — ED Notes (Signed)
 Patient transported to CT

## 2023-09-11 NOTE — ED Triage Notes (Signed)
 Left jaw/down neck (lateral) red/swollen/painful started Wednesday. Able to swallow

## 2023-09-12 DIAGNOSIS — J9601 Acute respiratory failure with hypoxia: Secondary | ICD-10-CM

## 2023-09-12 DIAGNOSIS — K1120 Sialoadenitis, unspecified: Secondary | ICD-10-CM

## 2023-09-12 DIAGNOSIS — J189 Pneumonia, unspecified organism: Secondary | ICD-10-CM | POA: Diagnosis not present

## 2023-09-12 LAB — HIV ANTIBODY (ROUTINE TESTING W REFLEX): HIV Screen 4th Generation wRfx: NONREACTIVE

## 2023-09-12 MED ORDER — TRAZODONE HCL 50 MG PO TABS: 100.0000 mg | ORAL_TABLET | Freq: Every evening | ORAL | Status: DC | PRN

## 2023-09-12 MED ORDER — NAPROXEN 250 MG PO TABS
375.0000 mg | ORAL_TABLET | Freq: Three times a day (TID) | ORAL | Status: DC
  Administered 2023-09-12 – 2023-09-13 (×4): 375 mg via ORAL
  Filled 2023-09-12: qty 1
  Filled 2023-09-12 (×5): qty 2

## 2023-09-12 MED ORDER — SUMATRIPTAN SUCCINATE 100 MG PO TABS
100.0000 mg | ORAL_TABLET | ORAL | Status: DC | PRN
Start: 2023-09-12 — End: 2023-09-13

## 2023-09-12 MED ORDER — DEXAMETHASONE SODIUM PHOSPHATE 10 MG/ML IJ SOLN
8.0000 mg | Freq: Three times a day (TID) | INTRAMUSCULAR | Status: AC
  Administered 2023-09-12 – 2023-09-13 (×3): 8 mg via INTRAVENOUS
  Filled 2023-09-12 (×3): qty 0.8

## 2023-09-12 MED ORDER — TRAZODONE HCL 50 MG PO TABS
50.0000 mg | ORAL_TABLET | Freq: Every evening | ORAL | Status: DC | PRN
  Administered 2023-09-12: 50 mg via ORAL
  Filled 2023-09-12: qty 1

## 2023-09-12 NOTE — Progress Notes (Signed)
  Progress Note   Patient: Shelia Cunningham RUE:[id number] DOB: 06/11/1940 DOA: 09/11/2023     1 DOS: the patient was seen and examined on 09/12/2023   Brief hospital course: 83 y.o. 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  Symptoms have been going on for 4 days.  Patient was seen by PCP and diagnosed with TMJ's dysfunction.  She was on cyclobenzaprine for that.  Pain however did not improve.  It is has gotten worse to 7 out of 10.  Is waxing and waning.  Pain is worse with any attempt to eat.  Patient was seen in the ER and evaluated.  ENT was consulted.  Suggested that patient had acute parotitis.  As patient was seen in the ER was noted to have hypoxia.  Required 2 L of oxygen  per minute.  Subsequent chest x-ray showed bilateral pneumonia left greater than right.  Patient is therefore being admitted to the hospital for further evaluation and treatment.  She is still hypoxic.  No cough.  No sick contacts.  Acute viral screen so far negative.   Assessment and Plan: #1 multifocal pneumonia: Suspected aspiration.  Blood cultures neg thus far.  Pt is continued on IV Rocephin, doxycycline, Flagyl.  -Initially requiring supplemental O2 in ED. Now weaned to RA   #2 acute parotitis: Continue the antibiotics as above.  Continue monitoring.  -discussed case with ENT. Recommendation for decadron  8mg  q8h x 3 doses, lemon with water, warm compress, and massage to area   #3 MAI infection: Patient has just completed treatment.  Followed by pulmonary. Continue with abx per above. Recommend pt f/u with Pulmonary as already scheduled in the next 2 weeks   #4 history of recurrent infections: Continue to monitor.   #5 bronchiectasis: Continue breathing treatments       Subjective: Feeling  better today  Physical Exam: Vitals:   09/11/23 1743 09/11/23 2033 09/12/23 0107 09/12/23 0857  BP:  (!) 130/47  (!) 118/49  Pulse:  84  74  Resp:  20  18  Temp: 98 F (36.7 C) 98.4 F (36.9 C)  97.7 F (36.5 C)  TempSrc: Oral Oral  Oral  SpO2:  93%  97%  Weight:      Height:   5\' 5"  (1.651 m)    General exam: Awake, laying in bed, in nad, L neck/facial swelling Respiratory system: Normal respiratory effort, no wheezing Cardiovascular system: regular rate, s1, s2 Gastrointestinal system: Soft, nondistended, positive BS Central nervous system: CN2-12 grossly intact, strength intact Extremities: Perfused, no clubbing Skin: Normal skin turgor, no notable skin lesions seen Psychiatry: Mood normal // no visual hallucinations   Data Reviewed:  There are no new results to review at this time.  Family Communication: Pt in room, family not at bedside  Disposition: Status is: Inpatient Remains inpatient appropriate because: severity of illness  Planned Discharge Destination: Home    Author: Cherylle Corwin, MD 09/12/2023 5:09 PM  For on call review [email or web address].

## 2023-09-12 NOTE — Hospital Course (Signed)
 83 y.o. 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  Symptoms have been going on for 4 days.  Patient was seen by PCP and diagnosed with TMJ's dysfunction.  She was on cyclobenzaprine for that.  Pain however did not improve.  It is has gotten worse to 7 out of 10.  Is waxing and waning.  Pain is worse with any attempt to eat.  Patient was seen in the ER and evaluated.  ENT was consulted.  Suggested that patient had acute parotitis.  As patient was seen in the ER was noted to have hypoxia.  Required 2 L of oxygen  per minute.  Subsequent chest x-ray showed bilateral pneumonia left greater than right.  Patient is therefore being admitted to the hospital for further evaluation and treatment.  She is still hypoxic.  No cough.  No sick contacts.  Acute viral screen so far negative.

## 2023-09-13 ENCOUNTER — Other Ambulatory Visit (HOSPITAL_COMMUNITY): Payer: Self-pay

## 2023-09-13 DIAGNOSIS — K1120 Sialoadenitis, unspecified: Secondary | ICD-10-CM | POA: Diagnosis not present

## 2023-09-13 DIAGNOSIS — J9601 Acute respiratory failure with hypoxia: Secondary | ICD-10-CM | POA: Diagnosis not present

## 2023-09-13 MED ORDER — DOXYCYCLINE HYCLATE 100 MG PO TABS
100.0000 mg | ORAL_TABLET | Freq: Two times a day (BID) | ORAL | 0 refills | Status: AC
  Filled 2023-09-13: qty 16, 8d supply, fill #0

## 2023-09-13 MED ORDER — TRAZODONE HCL 50 MG PO TABS
50.0000 mg | ORAL_TABLET | Freq: Every evening | ORAL | 0 refills | Status: AC | PRN
  Filled 2023-09-13: qty 30, 30d supply, fill #0

## 2023-09-13 MED ORDER — METRONIDAZOLE 500 MG PO TABS
500.0000 mg | ORAL_TABLET | Freq: Two times a day (BID) | ORAL | Status: DC
  Administered 2023-09-13: 500 mg via ORAL
  Filled 2023-09-13: qty 1

## 2023-09-13 MED ORDER — DOXYCYCLINE HYCLATE 100 MG PO TABS
100.0000 mg | ORAL_TABLET | Freq: Two times a day (BID) | ORAL | Status: DC
  Administered 2023-09-13: 100 mg via ORAL
  Filled 2023-09-13: qty 1

## 2023-09-13 MED ORDER — AMOXICILLIN-POT CLAVULANATE 875-125 MG PO TABS
1.0000 | ORAL_TABLET | Freq: Two times a day (BID) | ORAL | 0 refills | Status: AC
  Filled 2023-09-13: qty 16, 8d supply, fill #0

## 2023-09-13 NOTE — TOC CM/SW Note (Addendum)
 Transition of Care [facility]) - Inpatient Brief Assessment   Patient Details  Name: Shelia Cunningham MRN: [id number] Date of Birth: [birth year]  [facility]) CM/SW Contact:    Tom-Johnson, Angelique Ken, RN Phone Number: 09/13/2023, 2:37 PM   Clinical Narrative:  Patient presented to the ED with Red, Swollen Left Jaw/Neck pain. Admitted with Parotitis and found to have Pneumonia. Started on IV abx, now transitioning to Oral abx. ENT following.   From home with husband, has a supportive daughter who lives in [place]. Has a handicapped bathroom and has access to [employer].  PCP is Jeannine Milroy., MD and uses [facility] on [address].  No TOC needs or recommendations noted at this time.  Patient not Medically ready for discharge.  CM will continue to follow as patient progresses with care towards discharge.                   Transition of Care Asessment: Insurance and Status: Insurance coverage has been reviewed Patient has primary care physician: Yes Home environment has been reviewed: Yes Prior level of function:: Independent Prior/Current Home Services: No current home services Social Drivers of Health Review: SDOH reviewed no interventions necessary Readmission risk has been reviewed: Yes Transition of care needs: no transition of care needs at this time

## 2023-09-13 NOTE — TOC Transition Note (Signed)
 Transition of Care [facility]) - Discharge Note   Patient Details  Name: Shelia Cunningham MRN: [id number] Date of Birth: 07-Jan-1941  Transition [facility]) CM/SW Contact:  Tom-Johnson, Blakelynn Scheeler Daphne, RN Phone Number: 09/13/2023, 4:46 PM   Clinical Narrative:     Patient is scheduled for discharge today.  Readmission Risk Assessment done. Hospital f/u and discharge instructions on AVS. Prescriptions sent to [facility] pharmacy and patient will receive meds prior discharge. No TOC needs or recommendations noted. Husband, Caretha Chapel to transport at discharge.  No further TOC needs noted.       Final next level of care: Home/Self Care Barriers to Discharge: Barriers Resolved   Patient Goals and CMS Choice Patient states their goals for this hospitalization and ongoing recovery are:: To return home CMS Medicare.gov Compare Post Acute Care list provided to:: Patient Choice offered to / list presented to : NA      Discharge Placement                Patient to be transferred to facility by: Husband Name of family member notified: Caretha Chapel    Discharge Plan and Services Additional resources added to the After Visit Summary for                  DME Arranged: Bedside commode DME Agency: NA       HH Arranged: NA HH Agency: NA        Social Drivers of Health (SDOH) Interventions SDOH Screenings   Food Insecurity: No Food Insecurity (09/12/2023)  Housing: Low Risk  (09/12/2023)  Transportation Needs: No Transportation Needs (09/12/2023)  Utilities: Not At Risk (09/12/2023)  Social Connections: Moderately Integrated (09/12/2023)  Tobacco Use: Low Risk  (09/11/2023)     Readmission Risk Interventions    09/13/2023    2:36 PM  Readmission Risk Prevention Plan  Post Dischage Appt Complete  Medication Screening Complete  Transportation Screening Complete

## 2023-09-13 NOTE — Discharge Summary (Signed)
 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 *  Hospital Course: 83 y.o. 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  Symptoms have been going on for 4 days.  Patient was seen by PCP and diagnosed with TMJ's dysfunction.  She was on cyclobenzaprine for that.  Pain however did not improve.  It is has gotten worse to 7 out of 10.  Is waxing and waning.  Pain is worse with any attempt to eat.  Patient was seen in the ER and evaluated.  ENT was consulted.  Suggested that patient had acute parotitis.  As patient was seen in the ER was noted to have hypoxia.  Required 2 L of oxygen  per minute.  Subsequent chest x-ray showed bilateral pneumonia left greater than right.  Patient is therefore being admitted to the hospital for further evaluation and treatment.  She is still hypoxic.  No cough.  No sick contacts.  Acute viral screen so far negative.   Assessment and Plan: #1 multifocal pneumonia: Suspected aspiration.  Blood cultures neg thus far.  Pt was continued on IV Rocephin, doxycycline, Flagyl.  -Remained stable -Discussed with ID pharmacy who recommended completing course of augmentin  with doxycycline   #2 acute parotitis: Continue the  antibiotics as above.  Continue monitoring.  -discussed case with ENT. Pt received decadron  8mg  q8h x 3 doses per ENT recs, -Improved   #3 MAI infection: Patient has just completed treatment.  Followed by pulmonary. Continue with abx per above. Recommend pt f/u with Pulmonary as already scheduled in the next 2 weeks   #4 history of recurrent infections: Continue to monitor.   #5 bronchiectasis: Continued breathing treatments    Consultants: ENT Procedures performed:   Disposition: Home Diet recommendation:  Regular diet DISCHARGE MEDICATION: Allergies as of 09/13/2023       Reactions   Alpha-gal Hives   Red meat and other mammal products   Tick-borne Encephalitis Vacc    Antivenin, Paralysis Tick Rash, Other (See Comments)   Patient cannot recall how many ticks she's been bitten by.    Clarithromycin Rash   Severe rash over entire body (Biaxin)        Medication List     STOP taking these medications    levofloxacin  750 MG tablet Commonly known as: LEVAQUIN    predniSONE  5 MG tablet Commonly known as: DELTASONE        TAKE these medications    acetaminophen  500 MG tablet Commonly known as: TYLENOL  Take 1,000 mg by mouth every 6 (six) hours as needed for mild pain (pain score 1-3) or moderate pain (pain score 4-6).   amLODipine  5 MG tablet Commonly known as: NORVASC  Take 5 mg by mouth daily.   amoxicillin -clavulanate 875-125 MG tablet Commonly known as: AUGMENTIN  Take  1 tablet by mouth 2 (two) times daily for 8 days.   azelastine  0.1 % nasal spray Commonly known as: ASTELIN  Place 2 sprays into both nostrils 2 (two) times daily as needed for rhinitis. Use in each nostril as directed   CALCIUM  PLUS VITAMIN D PO Take 1 tablet by mouth 2 (two) times daily.   COLLAGEN PO Take 1 tablet by mouth daily at 12 noon.   cyclobenzaprine 10 MG tablet Commonly known as: FLEXERIL Take 10 mg by mouth at bedtime as needed for muscle spasms (TMJ).   denosumab  60 MG/ML  Sosy injection Commonly known as: PROLIA  Inject 60 mg into the skin every 6 (six) months.   doxycycline 100 MG tablet Commonly known as: VIBRA-TABS Take 1 tablet (100 mg total) by mouth every 12 (twelve) hours for 8 days.   EPINEPHrine  0.3 mg/0.3 mL Soaj injection Commonly known as: EpiPen  2-Pak Inject 0.3 mg into the muscle as needed for anaphylaxis.   meclizine 25 MG tablet Commonly known as: ANTIVERT Take 25 mg by mouth 3 (three) times daily as needed for dizziness.   meloxicam  7.5 MG tablet Commonly known as: MOBIC  TAKE 1 TABLET BY MOUTH EVERY DAY   multivitamin capsule Take 1 capsule by mouth daily.   multivitamin with minerals Tabs tablet Take 1 tablet by mouth daily.   omeprazole  40 MG capsule Commonly known as: PRILOSEC Take 40 mg by mouth daily.   SUMAtriptan 100 MG tablet Commonly known as: IMITREX Take 100 mg by mouth as needed for migraine. May repeat in 2 hours if headache persists or recurs.   traZODone 50 MG tablet Commonly known as: DESYREL Take 1 tablet (50 mg total) by mouth at bedtime as needed for sleep.        Follow-up Information     Jeannine Milroy., MD Follow up in 2 week(s).   Specialty: Internal Medicine Why: Hospital follow up Contact information: [address] [place] [address] [phone]                Discharge Exam: Cleavon Curls Weights   09/11/23 1350  Weight: 62.6 kg   General exam: Awake, laying in bed, in nad Respiratory system: Normal respiratory effort, no wheezing Cardiovascular system: regular rate, s1, s2 Gastrointestinal system: Soft, nondistended, positive BS Central nervous system: CN2-12 grossly intact, strength intact Extremities: Perfused, no clubbing Skin: Normal skin turgor, no notable skin lesions seen Psychiatry: Mood normal // no visual hallucinations   Condition at discharge: fair  The results of significant diagnostics from this hospitalization (including imaging, microbiology,  ancillary and laboratory) are listed below for reference.   Imaging Studies: DG Chest Port 1 View Result Date: 09/11/2023 CLINICAL DATA:  Painful, red and swollen left-sided neck EXAM: PORTABLE CHEST 1 VIEW COMPARISON:  11/08/2015 FINDINGS: Midline trachea. Mild cardiomegaly. Atherosclerosis in the transverse aorta. Possible small left pleural effusion. No pneumothorax. Moderate pulmonary interstitial thickening and coarsening. Left greater than right lower lobe airspace disease, most significant at the bases. Possible right mid to lower lung 8 mm nodular density. IMPRESSION: Left greater than right lower lobe airspace disease, favoring pneumonia or aspiration. Followup PA and lateral chest X-ray is recommended in 3-4 weeks following trial of antibiotic therapy to ensure resolution and exclude underlying malignancy. Possible right mid to lower lung 8 mm nodular density can be re-evaluated on follow-up plain film. Underlying interstitial coarsening is most typically seen with smoking and/or chronic bronchitis. Aortic Atherosclerosis (ICD10-I70.0). Electronically Signed   By: Marzella Solan.D.  On: 09/11/2023 16:41   CT Soft Tissue Neck W Contrast Result Date: 09/11/2023 CLINICAL DATA:  Tissue swelling left neck.  Question parotitis. EXAM: CT NECK WITH CONTRAST TECHNIQUE: Multidetector CT imaging of the neck was performed using the standard protocol following the bolus administration of intravenous contrast. RADIATION DOSE REDUCTION: This exam was performed according to the departmental dose-optimization program which includes automated exposure control, adjustment of the mA and/or kV according to patient size and/or use of iterative reconstruction technique. CONTRAST:  75mL OMNIPAQUE IOHEXOL 300 MG/ML  SOLN COMPARISON:  None Available. FINDINGS: Pharynx and larynx: No focal mucosal or submucosal lesions are present. The nasopharynx is clear. Soft palate and tongue base are within normal limits. Vallecula and  epiglottis are within normal limits. Aryepiglottic folds and piriform sinuses are clear. Vocal cords are midline and symmetric. Trachea is clear. Salivary glands: Asymmetric diffuse inflammatory changes are present within the left parotid gland. No mass lesion is present. Obstruction is evident. The right parotid gland is normal. Secondary inflammatory changes extend into the left submandibular space. The left submandibular gland and duct is normal. Right submandibular gland and duct is normal. Thyroid : Normal Lymph nodes: Asymmetric subcentimeter left level 2 level 3 nodes are reactive. No significant right-sided adenopathy is present. Vascular: Minimal atherosclerotic calcifications are present at the carotid bifurcations bilaterally. No significant stenosis is present. Limited intracranial: Within normal limits for age. Visualized orbits: Bilateral lens replacements are noted. Globes and orbits are otherwise unremarkable. Mastoids and visualized paranasal sinuses: The paranasal sinuses and mastoid air cells are clear. Skeleton: Vertebral body heights and alignment are normal. Mild endplate changes uncovertebral spurring is noted at C4-5, C5-6 C6-7. Foraminal narrowing is greatest the left at C4-5 and left greater than right C5-6. Upper chest: Patchy nodular airspace opacities are present in the right upper lobe and superior segment of the right lower lobe. The left lung is clear. Airways are patent. IMPRESSION: 1. Asymmetric diffuse inflammatory changes within the left parotid gland compatible with parotitis. No mass lesion or obstruction is evident. 2. Secondary inflammatory changes extend into the left submandibular space. 3. Asymmetric subcentimeter left level 2 and level 3 nodes are reactive. 4. Patchy nodular airspace opacities in the right upper lobe and superior segment of the right lower lobe compatible with pneumonia. 5. Multilevel spondylosis of the cervical spine as described. Electronically Signed    By: Audree Leas M.D.   On: 09/11/2023 16:19    Microbiology: Results for orders placed or performed during the hospital encounter of 09/11/23  Blood Culture (routine x 2)     Status: None (Preliminary result)   Collection Time: 09/11/23  2:57 PM   Specimen: BLOOD LEFT ARM  Result Value Ref Range Status   Specimen Description BLOOD LEFT ARM  Final   Special Requests   Final    Blood Culture adequate volume BOTTLES DRAWN AEROBIC AND ANAEROBIC   Culture   Final    NO GROWTH 2 DAYS Performed at [facility], [address] [place], [place] [address]    Report Status PENDING  Incomplete  Blood Culture (routine x 2)     Status: None (Preliminary result)   Collection Time: 09/11/23  2:57 PM   Specimen: BLOOD RIGHT ARM  Result Value Ref Range Status   Specimen Description   Final    BLOOD RIGHT ARM Performed at [facility], [address] [place], [place] [address]    Special Requests   Final    Blood  Culture adequate volume Performed at [facility], [address], [place], [place] [address]    Culture   Final    NO GROWTH 2 DAYS Performed at [facility], [address] [place], [place] [address]    Report Status PENDING  Incomplete    Labs: CBC: Recent Labs  Lab 09/11/23 1455 09/11/23 2239  WBC 8.9 8.5  NEUTROABS 6.8  --   HGB 10.3* 9.8*  HCT 31.7* 30.7*  MCV 94.3 93.6  PLT 234 224   Basic Metabolic Panel: Recent Labs  Lab 09/11/23 1455 09/11/23 2239  NA 140  --   K 3.7  --   CL 103  --   CO2 28  --   GLUCOSE 145*  --   BUN 12  --   CREATININE 0.59 0.97  CALCIUM  9.1  --    Liver Function Tests: Recent Labs  Lab 09/11/23 1455  AST 37  ALT 30  ALKPHOS 90  BILITOT 0.2  PROT 6.4*  ALBUMIN  3.6   CBG: No results for input(s): "GLUCAP" in the last 168 hours.  Discharge time spent: less than 30 minutes.  Signed: Cherylle Corwin, MD [facility] 09/13/2023

## 2023-09-13 NOTE — Progress Notes (Signed)
 DISCHARGE NOTE HOME Shelia Cunningham to be discharged Home per MD order. Discussed prescriptions and follow up appointments with the patient. Prescriptions given to patient; medication list explained in detail. Patient verbalized understanding.  Skin clean, dry and intact without evidence of skin break down, no evidence of skin tears noted. IV catheter discontinued intact. Site without signs and symptoms of complications. Dressing and pressure applied. Pt denies pain at the site currently. No complaints noted.  Patient free of lines, drains, and wounds.   An After Visit Summary (AVS) was printed and given to the patient. Patient escorted via wheelchair, and discharged home via private auto.  Elvina Hammers, RN

## 2023-09-14 LAB — BLOOD CULTURE ID PANEL (REFLEXED) - BCID2

## 2023-09-14 NOTE — Progress Notes (Signed)
 Overnight cross coverage TR[facility], hospitalist service.   Received a call from microbiology regarding the patient's blood cultures returning positive 1 out of 3 bottles growing gram-positive cocci in cluster from aerobic bottle only.    The patient was discharged on 09/13/2023 with instruction to complete the course of her antibiotic, Augmentin  875-125 mg, twice daily x 8 days.  Augmentin  covers gram-positive cocci in clusters.  Specific ID and sensitivities are pending.   No charge note.

## 2023-09-15 LAB — CULTURE, BLOOD (ROUTINE X 2): Special Requests: ADEQUATE

## 2023-09-16 DIAGNOSIS — R911 Solitary pulmonary nodule: Secondary | ICD-10-CM | POA: Diagnosis not present

## 2023-09-16 DIAGNOSIS — J189 Pneumonia, unspecified organism: Secondary | ICD-10-CM | POA: Diagnosis not present

## 2023-09-16 DIAGNOSIS — J479 Bronchiectasis, uncomplicated: Secondary | ICD-10-CM | POA: Diagnosis not present

## 2023-09-16 DIAGNOSIS — Z2239 Carrier of other specified bacterial diseases: Secondary | ICD-10-CM | POA: Diagnosis not present

## 2023-09-16 DIAGNOSIS — R5383 Other fatigue: Secondary | ICD-10-CM | POA: Diagnosis not present

## 2023-09-16 DIAGNOSIS — K1120 Sialoadenitis, unspecified: Secondary | ICD-10-CM | POA: Diagnosis not present

## 2023-09-16 DIAGNOSIS — G4700 Insomnia, unspecified: Secondary | ICD-10-CM | POA: Diagnosis not present

## 2023-09-16 LAB — CULTURE, BLOOD (ROUTINE X 2): Special Requests: ADEQUATE

## 2023-09-23 DIAGNOSIS — J470 Bronchiectasis with acute lower respiratory infection: Secondary | ICD-10-CM | POA: Diagnosis not present

## 2023-09-23 DIAGNOSIS — R053 Chronic cough: Secondary | ICD-10-CM | POA: Diagnosis not present

## 2023-09-23 DIAGNOSIS — J383 Other diseases of vocal cords: Secondary | ICD-10-CM | POA: Diagnosis not present

## 2023-09-27 ENCOUNTER — Encounter: Payer: Self-pay | Admitting: Internal Medicine

## 2023-09-27 ENCOUNTER — Ambulatory Visit: Admitting: Internal Medicine

## 2023-09-27 ENCOUNTER — Other Ambulatory Visit: Payer: Self-pay

## 2023-09-27 VITALS — BP 134/66 | HR 84 | Temp 98.0°F | Ht 64.9 in | Wt 137.2 lb

## 2023-09-27 DIAGNOSIS — J302 Other seasonal allergic rhinitis: Secondary | ICD-10-CM

## 2023-09-27 DIAGNOSIS — Z91018 Allergy to other foods: Secondary | ICD-10-CM | POA: Diagnosis not present

## 2023-09-27 DIAGNOSIS — J3089 Other allergic rhinitis: Secondary | ICD-10-CM

## 2023-09-27 DIAGNOSIS — J470 Bronchiectasis with acute lower respiratory infection: Secondary | ICD-10-CM

## 2023-09-27 MED ORDER — EPINEPHRINE 0.3 MG/0.3ML IJ SOAJ: 0.3000 mg | INTRAMUSCULAR | 2 refills | Status: AC | PRN

## 2023-09-27 NOTE — Patient Instructions (Addendum)
 Alpha gal allergy  - please strictly avoid mammalian meat (pork, beef, lamb, etc) - for SKIN only reaction, okay to take Benadryl  2 capsules every 4 hours - for SKIN + ANY additional symptoms, OR IF concern for LIFE THREATENING reaction = Epipen  Autoinjector EpiPen  0.3 mg. - If using Epinephrine  autoinjector, call 911 - A food allergy  action plan has been provided and discussed. - Medic Alert identification is recommended.  Allergic rhinitis/post nasal drainage - Continue avoidance of molds, dust mites, dog dander, mixed feathers, horse dander - Continue Nasacort (triamcinolone or Nasonex (mometasone) or Flonase Sensimist (fluticasone)-2 sprays in each nostril once daily, point tip towards ear lobe when spraying -Consider taking a nonsedating antihistamine daily as needed  - Your options include: Zyrtec (cetirizine) 10 mg, Claritin (loratadine) 10 mg, Xyzal (levocetirizine) 5 mg or Allegra (fexofenadine) 180 mg daily as needed - continue Astepro  (azelastine ) 1-2 sprays up to twice daily for ongoing congestion/runny nose.   Bronchiectasis: Recurrent pneumonias, history of Mycobacterium avium (MAI); right sided wheezing, asymptomatic - reassuring immune evaluation - continue follow-up with Pulmonary and use all devices as prescribed -make sure to use your vest and follow-up with pulmonary as scheduled.  Clarithryomycin/azithromycin allergy : delayed drug eruption - strict avoidance  Follow-up in 12 months, sooner if needed. It was a pleasure seeing you again in clinic today  Control of Dog or Cat Allergen  Avoidance is the best way to manage a dog or cat allergy . If you have a dog or cat and are allergic to dog or cats, consider removing the dog or cat from the home. If you have a dog or cat but don't want to find it a new home, or if your family wants a pet even though someone in the household is allergic, here are some strategies that may help keep symptoms at bay:  Keep the pet out of  your bedroom and restrict it to only a few rooms. Be advised that keeping the dog or cat in only one room will not limit the allergens to that room. Don't pet, hug or kiss the dog or cat; if you do, wash your hands with soap and water. High-efficiency particulate air (HEPA) cleaners run continuously in a bedroom or living room can reduce allergen levels over time. Regular use of a high-efficiency vacuum cleaner or a central vacuum can reduce allergen levels. Giving your dog or cat a bath at least once a week can reduce airborne allergen.  DUST MITE AVOIDANCE MEASURES:  There are three main measures that need and can be taken to avoid house dust mites:  Reduce accumulation of dust in general -reduce furniture, clothing, carpeting, books, stuffed animals, especially in bedroom  Separate yourself from the dust -use pillow and mattress encasements (can be found at stores such as [employer] or online) -avoid direct exposure to air condition flow -use a HEPA filter device, especially in the bedroom; you can also use a HEPA filter vacuum cleaner -wipe dust with a moist towel instead of a dry towel or broom when cleaning  Decrease mites and/or their secretions -wash clothing and linen and stuffed animals at highest temperature possible, at least every 2 weeks -stuffed animals can also be placed in a bag and put in a freezer overnight  Despite the above measures, it is impossible to eliminate dust mites or their allergen completely from your home.  With the above measures the burden of mites in your home can be diminished, with the goal of minimizing your allergic  symptoms.  Success will be reached only when implementing and using all means together.  Control of Mold Allergen   Mold and fungi can grow on a variety of surfaces provided certain temperature and moisture conditions exist.  Outdoor molds grow on plants, decaying vegetation and soil.  The major outdoor mold, Alternaria and  Cladosporium, are found in very high numbers during hot and dry conditions.  Generally, a late Summer - Fall peak is seen for common outdoor fungal spores.  Rain will temporarily lower outdoor mold spore count, but counts rise rapidly when the rainy period ends.  The most important indoor molds are Aspergillus and Penicillium.  Dark, humid and poorly ventilated basements are ideal sites for mold growth.  The next most common sites of mold growth are the bathroom and the kitchen.  Outdoor (Seasonal) Mold Control  Use air conditioning and keep windows closed Avoid exposure to decaying vegetation. Avoid leaf raking. Avoid grain handling. Consider wearing a face mask if working in moldy areas.    Indoor (Perennial) Mold Control   Maintain humidity below 50%. Clean washable surfaces with 5% bleach solution. Remove sources e.g. contaminated carpets.   CHRONIC THROAT CLEARING-WHAT TO DO!! Causes of chronic throat clearing may include the following (among others):  Acid reflux (lanyngopharyngeal reflux) Allergies (pollens, pet dander, dust mites, etc) Non allergic rhinitis (runny nose, post nasal drainage or congestion NOT caused by allergies-can be secondary to pollutants, cold air, changes in blood vessels to the nose as we age,etc) Environmental irritants (tobacco, smoke, air pollution) Asthma If present for a long period of time, throat clearing can become a habit. We will work with you to treat any of the medical reasons for throat clearing.  Your job is to help prevent the habit, which can cause damage (redness and swelling) to your vocal cords.  It will require a conscious effort on your behalf.  Tips for prevention of throat clearing:  Instead of clearing your throat, swallow instead.   Carrying around water (or something to drink) will help you move the mucus in the right direction.  IF you have the urge to clear your throat, drink your water. If you absolutely have to clear your  throat, use a non-traumatic exercise to do so.   Pant with your mouth open saying "[place], [place], [place]" with a powerful, breathy voice. Increase water intake.  This thins secretions, making them easier to swallow. Chew baking soda gum ([employer] H[employer]) which can help with swallowing, reflux, and throat clearing.  Try to chew up to three times daily.   If you experience jaw pain or headaches, decrease the amount of chewing. Suck on sugar free hard candy to help with swallowing. Have your friends and family remind you to swallow when they hear you throat clearing.  As this can be habit forming, sometimes you may not realize you are doing this.  Having someone point it out to you, will help you become more conscious of the behavior. BE PATIENT.  This will take time to resolve, and some do not see improvement until 8-12 weeks into therapy/behavior modifications.

## 2023-09-27 NOTE — Progress Notes (Signed)
 FOLLOW UP Date of Service/Encounter:  09/27/23  Subjective:  Shelia Cunningham (DOB: 07-07-1940) is a 83 y.o. female who [facility] in re-evaluation of the following: alpha gal, bronchiectasis History obtained from: chart review and patient.  For Review, LV was on 09/09/22  with Shelia Cunningham seen for routine follow-up. See below for summary of history and diagnostics.   ----------------------------------------------------- Pertinent History/Diagnostics:  Allergic Rhinitis:  Hesitant to try Atrovent because dry mucous membranes is driving some of her chronic throat clearing.  She also has a history of migraines, so we advised against Flonase due to its irritant effects. - SPT environmental panel (08/06/21): + penicillium, bipolaris, dust mites, dog, mixed feathers, horse - 2017: respiratory allergen serum panel was negative - 2018: cat 0.28, dog 0.15, grass 0.11, timothy 0.10, otherwise negative environmental allergen History of hives:    - December 2022, she woke up in the middle of the night and felt an itching coursing through her body and was also covered in welts lasting for about 5 days.    - tryptase baseline normal (5.8), CU index 3.6, alpha gal elevated 14.90, beef 1.39, lamb 1.16 Advised avoidance of mammalian meat; epipen  prescribed Bronchiectasis without asthma: managed by Pulmonary thought to stem from 3 pneumonias as an adult, starting in her 38s.  She denies recurrent ear or sinus infections.  Denies recurrent abscesses.  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  She does not remember ever having a bronchoscopy.  Follows with Pulmonary. uses flutter valve QID, assist vest, and mucinex.   - CT scan: 08/29/20-Spectrum of pulmonary parenchymal findings compatible with chronic atypical mycobacterial infection (MAI) with widespread moderate bronchiectasis,  scattered mucoid impaction and tree-in-bud opacities. Findings have mildly progressed since 12/16/2017 chest CT.  2. Aortic Atherosclerosis (ICD10-I70.0)  - 2018 total IgE 182, negative QuantiFERON gold, nondetectable Aspergillus galactomannan, negative hypersensitivity pneumonitis screen - several sputum cultures positive for Staph aureus, last in 2018, repeat cultures in 2019 showed normal throat flora  - immune labs 2023: protective tetanus and strep titers, adequate repeat diptheria titer, normal quants, normal T/B/NK enumerations, AEC 100, normal IgA/IgG/IgM Hx of drug rash from clarithromycin/azithromcyin --------------------------------------------------- Today presents for follow-up. Discussed the use of AI scribe software for clinical note transcription with the patient, who gave verbal consent to proceed.  History of Present Illness   Shelia Cunningham is an 83 year old female with recurrent pneumonia and bronchiectasis who presents for yearly follow-up of alpha gal.  She was recently hospitalized for pneumonia, which was discovered after she experienced severe pain and swelling between her ear and jawline. Initially thought to be temporomandibular joint disorder, she received an anti-inflammatory injection, but the pain worsened, leading to an emergency room visit where pneumonia was diagnosed and acute partotis. This occurred four days after completing a course of levofloxacin  for a bronchiectasis flare-up.  During her hospital stay, she was treated with three different antibiotics, including one for a parotid gland infection, amoxicillin , and doxycycline . She has since completed these antibiotics and reports a significant reduction in coughing, though she still feels fatigued and requires more rest than usual.  She has a history of recurrent pneumonia, having had it four times, and her lungs are scarred. She also has a Mycobacterium avium-intracellulare infection. She is scheduled for a  CT scan of her lungs next week due to a mass in the lower left lobe. Her brother recently  had a malignancy in the same area, which is a concern for her.  She takes omeprazole  daily and denies reflux symptoms. She has been diligent in avoiding alpha-gal due to a previous high test result, although she accidentally consumed pork and beef recently without a reaction. She uses azelastine  for nasal symptoms as needed and has an EpiPen  for emergencies.  Her social history includes caring for her husband, who has neuropathy and severe mobility issues, and managing the sale of their vacation home, which has contributed to her stress and fatigue. She is generally very active, being a Horticulturist, commercial and socially engaged, but has been less energetic recently.   She is not interested in further immunology work-up (genetic testing offered) nor referral to academic immunology for additional labs (unable to obtain T cell function despite initially trying with current [facility]).   She denies respiratory symptoms. Does have a vest for therapy but has not used in 3 months. Has pulm follow-up in 3 weeks, CT next week.  She does not wish to retest for alpha gal due to this not initially being covered by her insurance and despite multiple attempts at appeal, labs were not covered.     All medications reviewed by clinical staff and updated in chart. No new pertinent medical or surgical history except as noted in HPI.  ROS: All others negative except as noted per HPI.   Objective:  BP 134/66 (BP Location: Left Arm, Patient Position: Sitting, Cuff Size: Normal)   Pulse 84   Temp 98 F (36.7 C) (Temporal)   Ht 5' 4.9" (1.648 m)   Wt 137 lb 3.2 oz (62.2 kg)   SpO2 96%   BMI 22.90 kg/m  Body mass index is 22.9 kg/m. Physical Exam: General Appearance:  Alert, cooperative, no distress, appears stated age  Head:  Normocephalic, without obvious abnormality, atraumatic  Eyes:  Conjunctiva clear, EOM's intact  Ears EACs  normal bilaterally and normal TMs bilaterally  Nose: Nares normal, hypertrophic turbinates, normal mucosa, and no visible anterior polyps  Throat: Lips, tongue normal; teeth and gums normal, normal posterior oropharynx  Neck: Supple, symmetrical  Lungs:   Wheezing auscultated on right upper and lower lung, Respirations unlabored, no coughing  Heart:  regular rate and rhythm and no murmur, Appears well perfused  Extremities: No edema  Skin: Skin color, texture, turgor normal and no rashes or lesions on visualized portions of skin  Neurologic: No gross deficits   Labs:  Lab Orders  No laboratory test(s) ordered today    Assessment/Plan   Alpha gal allergy -stable - please strictly avoid mammalian meat (pork, beef, lamb, etc) - for SKIN only reaction, okay to take Benadryl  2 capsules every 4 hours - for SKIN + ANY additional symptoms, OR IF concern for LIFE THREATENING reaction = Epipen  Autoinjector EpiPen  0.3 mg. - If using Epinephrine  autoinjector, call 911 - A food allergy  action plan has been provided and discussed. - Medic Alert identification is recommended.  Allergic rhinitis/post nasal drainage-stable - Continue avoidance of molds, dust mites, dog dander, mixed feathers, horse dander - Continue Nasacort (triamcinolone or Nasonex (mometasone) or Flonase Sensimist (fluticasone)-2 sprays in each nostril once daily, point tip towards ear lobe when spraying -Consider taking a nonsedating antihistamine daily as needed  - Your options include: Zyrtec (cetirizine) 10 mg, Claritin (loratadine) 10 mg, Xyzal (levocetirizine) 5 mg or Allegra (fexofenadine) 180 mg daily as needed - continue Astepro  (azelastine ) 1-2 sprays up to twice daily for ongoing congestion/runny nose.   Bronchiectasis: Recurrent  pneumonias, history of Mycobacterium avium (MAI); right sided wheezing, asymptomatic-not at goal, consider additional immunology evaluation. - reassuring immune evaluation of humoral system, no  T cell function has been obtained, no genetic testing has been obtained - consider academic immunology referral +/- genetic testing - continue follow-up with Pulmonary and use all devices as prescribed -make sure to use your vest and follow-up with pulmonary as scheduled.  Clarithryomycin/azithromycin allergy : delayed drug eruption - strict avoidance  Follow-up in 12 months, sooner if needed. It was a pleasure seeing you again in clinic today  Other: none  Jonathon Neighbors, MD  [facility] 

## 2023-09-29 DIAGNOSIS — J470 Bronchiectasis with acute lower respiratory infection: Secondary | ICD-10-CM | POA: Diagnosis not present

## 2023-09-29 DIAGNOSIS — J449 Chronic obstructive pulmonary disease, unspecified: Secondary | ICD-10-CM | POA: Diagnosis not present

## 2023-09-29 DIAGNOSIS — R053 Chronic cough: Secondary | ICD-10-CM | POA: Diagnosis not present

## 2023-09-29 DIAGNOSIS — R911 Solitary pulmonary nodule: Secondary | ICD-10-CM | POA: Diagnosis not present

## 2023-09-29 DIAGNOSIS — J383 Other diseases of vocal cords: Secondary | ICD-10-CM | POA: Diagnosis not present

## 2023-09-29 DIAGNOSIS — R918 Other nonspecific abnormal finding of lung field: Secondary | ICD-10-CM | POA: Diagnosis not present

## 2023-10-02 ENCOUNTER — Other Ambulatory Visit: Payer: Self-pay | Admitting: Physician Assistant

## 2023-10-05 ENCOUNTER — Telehealth: Payer: Self-pay | Admitting: Physician Assistant

## 2023-10-05 NOTE — Telephone Encounter (Signed)
 Patient called she would like meloxicam  or something called in for her.

## 2023-10-21 DIAGNOSIS — J470 Bronchiectasis with acute lower respiratory infection: Secondary | ICD-10-CM | POA: Diagnosis not present

## 2023-10-21 DIAGNOSIS — R053 Chronic cough: Secondary | ICD-10-CM | POA: Diagnosis not present

## 2023-10-21 DIAGNOSIS — R911 Solitary pulmonary nodule: Secondary | ICD-10-CM | POA: Diagnosis not present

## 2023-10-21 DIAGNOSIS — J383 Other diseases of vocal cords: Secondary | ICD-10-CM | POA: Diagnosis not present

## 2023-11-10 DIAGNOSIS — D485 Neoplasm of uncertain behavior of skin: Secondary | ICD-10-CM | POA: Diagnosis not present

## 2023-11-10 DIAGNOSIS — L821 Other seborrheic keratosis: Secondary | ICD-10-CM | POA: Diagnosis not present

## 2023-11-10 DIAGNOSIS — D225 Melanocytic nevi of trunk: Secondary | ICD-10-CM | POA: Diagnosis not present

## 2023-11-10 DIAGNOSIS — L720 Epidermal cyst: Secondary | ICD-10-CM | POA: Diagnosis not present

## 2023-11-10 DIAGNOSIS — Z85828 Personal history of other malignant neoplasm of skin: Secondary | ICD-10-CM | POA: Diagnosis not present

## 2023-11-10 DIAGNOSIS — D2272 Melanocytic nevi of left lower limb, including hip: Secondary | ICD-10-CM | POA: Diagnosis not present

## 2023-11-10 DIAGNOSIS — L738 Other specified follicular disorders: Secondary | ICD-10-CM | POA: Diagnosis not present

## 2023-11-10 DIAGNOSIS — D1801 Hemangioma of skin and subcutaneous tissue: Secondary | ICD-10-CM | POA: Diagnosis not present

## 2023-12-15 ENCOUNTER — Telehealth (HOSPITAL_COMMUNITY): Payer: Self-pay

## 2023-12-15 NOTE — Telephone Encounter (Signed)
 Auth Submission: NO AUTH NEEDED Site of care: Site of care: [facility] Payer: Medicare A/B Medication & CPT/J Code(s) submitted: Prolia  (Denosumab ) [id number] Diagnosis Code: M81.0 Route of submission (phone, fax, portal):  Phone # Fax # Auth type: Buy/Bill HB Units/visits requested: 60mg  x 2 doses Reference number:  Approval from: 12/15/23 to 06/10/24

## 2023-12-16 ENCOUNTER — Encounter (HOSPITAL_BASED_OUTPATIENT_CLINIC_OR_DEPARTMENT_OTHER): Payer: Self-pay | Admitting: Physician Assistant

## 2023-12-16 ENCOUNTER — Ambulatory Visit (INDEPENDENT_AMBULATORY_CARE_PROVIDER_SITE_OTHER): Admitting: Physician Assistant

## 2023-12-16 DIAGNOSIS — M1712 Unilateral primary osteoarthritis, left knee: Secondary | ICD-10-CM | POA: Insufficient documentation

## 2023-12-16 DIAGNOSIS — M25562 Pain in left knee: Secondary | ICD-10-CM

## 2023-12-16 MED ORDER — TRIAMCINOLONE ACETONIDE 40 MG/ML IJ SUSP
40.0000 mg | INTRAMUSCULAR | Status: AC | PRN
  Administered 2023-12-16: 40 mg via INTRA_ARTICULAR

## 2023-12-16 MED ORDER — LIDOCAINE HCL 1 % IJ SOLN
2.0000 mL | INTRAMUSCULAR | Status: AC | PRN
  Administered 2023-12-16: 2 mL

## 2023-12-16 MED ORDER — METHYLPREDNISOLONE ACETATE 40 MG/ML IJ SUSP
40.0000 mg | INTRAMUSCULAR | Status: AC | PRN
  Administered 2023-12-16: 40 mg via INTRA_ARTICULAR

## 2023-12-16 NOTE — Progress Notes (Signed)
 Office Visit Note   Patient: Shelia Cunningham           Date of Birth: [birth year]           MRN: [id number] Visit Date: 12/16/2023              Requested by: Loreli Elsie JONETTA Mickey., MD [address] [place],  [place] [address] PCP: Loreli Elsie JONETTA Mickey., MD  Chief Complaint  Patient presents with  . Left Knee - Pain      HPI: Patient is a pleasant 83 year old woman with a history of osteoarthritis of the left knee.  She says the pain is gotten that sometimes she feels like her knee gives out on her when it hurts.  Will go forward with an injection today  Assessment & Plan: Visit Diagnoses:  1. Unilateral primary osteoarthritis, left knee     Plan: Santina forward with an injection today may follow-up as needed.  Of note initial injection had small defect of burger at the tip of the needle.  Needle was removed and examined and intact however did have a small manufacturing defect at the very tip at the taper.  Was able to inject with a new needle set up without difficulty  Follow-Up Instructions: Return if symptoms worsen or fail to improve.   Ortho Exam  Patient is alert, oriented, no adenopathy, well-dressed, normal affect, normal respiratory effort. Examination of her left knee no effusion no erythema compartments are soft and compressible she is neurovascular intact    Imaging: No results found. No images are attached to the encounter.  Labs: Lab Results  Component Value Date   REPTSTATUS 09/16/2023 FINAL 09/11/2023   REPTSTATUS 09/15/2023 FINAL 09/11/2023   CULT  09/11/2023    NO GROWTH 5 DAYS Performed at [facility], [address], [place], [place] [address]    CULT (A) 09/11/2023    MICROCOCCUS LUTEUS/LYLAE Standardized susceptibility testing for this organism is not available. Performed at [facility], [address], [place], [place] [address]      Lab Results  Component Value Date   ALBUMIN  3.6 09/11/2023   ALBUMIN  3.3 (L) 09/23/2012     No results found for: MG No results found for: VD25OH  No results found for: PREALBUMIN    Latest Ref Rng & Units 09/11/2023   10:39 PM 09/11/2023    2:55 PM 08/06/2021   11:02 AM  CBC EXTENDED  WBC 4.0 - 10.5 K/uL 8.5  8.9  5.1   RBC 3.87 - 5.11 MIL/uL 3.28  3.36  4.31   Hemoglobin 12.0 - 15.0 g/dL 9.8  89.6  86.8   HCT 63.9 - 46.0 % 30.7  31.7  40.5   Platelets 150 - 400 K/uL 224  234  221   NEUT# 1.7 - 7.7 K/uL  6.8  2.8   Lymph# 0.7 - 4.0 K/uL  1.1  1.8      There is no height or weight on file to calculate BMI.  Orders:  No orders of the defined types were placed in this encounter.  No orders of the defined types were placed in this encounter.    Procedures: Large Joint Inj: L knee on 12/16/2023 10:49 AM Indications: pain and diagnostic evaluation Details: 25 G 1.5 in needle, anteromedial approach  Arthrogram: No  Medications: 40 mg methylPREDNISolone  acetate 40 MG/ML; 2 mL lidocaine  1 %; 40 mg triamcinolone  acetonide 40 MG/ML Outcome: tolerated well, no immediate complications Procedure, treatment  alternatives, risks and benefits explained, specific risks discussed. Consent was given by the patient. Immediately prior to procedure a time out was called to verify the correct patient, procedure, equipment, support staff and site/side marked as required. Patient was prepped and draped in the usual sterile fashion.     Clinical Data: No additional findings.  ROS:  All other systems negative, except as noted in the HPI. Review of Systems  Objective: Vital Signs: There were no vitals taken for this visit.  Specialty Comments:  No specialty comments available.  PMFS History: Patient Active Problem List   Diagnosis Date Noted  . Unilateral primary osteoarthritis, left knee 12/16/2023  . Parotitis, acute 09/11/2023  . Trochanteric bursitis, left hip 03/03/2023  . Bilateral primary osteoarthritis of knee 06/02/2022  . Allergy  to alpha-gal 10/22/2021  .  Seasonal and perennial allergic rhinitis 08/06/2021  . Recurrent infections 08/06/2021  . Bronchiectasis with acute lower respiratory infection (HCC) 08/06/2021  . Allergic urticaria 08/06/2021  . Chronic throat clearing 08/06/2021  . Angina pectoris syndrome (HCC)  equivalent = thorat pain with exertion  03/08/2016  . 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 S/P laparoscopic appendectomy 10/26/2012  . Acute appendicitis with perforation and peritoneal abscess 09/23/2012  . Pneumonia 02/23/2012  . Upper airway cough syndrome 02/11/2012   Past Medical History:  Diagnosis Date  . Chronic migraine   . GERD (gastroesophageal reflux disease)   . Insomnia   . Osteoporosis   . Pneumonia, organism unspecified(486) 12-25-11  . Vertigo, benign positional     Family History  Problem Relation Age of Onset  . Breast cancer Mother 31  . Bladder Cancer Father 44  . Lung cancer Maternal Grandfather   . Alcoholism Son   . Allergic rhinitis Neg Hx   . Angioedema Neg Hx   . Asthma Neg Hx   . Atopy Neg Hx   . Eczema Neg Hx   . Urticaria Neg Hx   . Immunodeficiency Neg Hx     Past Surgical History:  Procedure Laterality Date  . ADENOIDECTOMY    . CATARACT EXTRACTION, BILATERAL    . cells removed    . IRRIGATION AND DEBRIDEMENT SEBACEOUS CYST     on scalp/ 2 cysts  removed  . LAPAROSCOPIC APPENDECTOMY N/A 09/23/2012   Procedure: APPENDECTOMY LAPAROSCOPIC;  Surgeon: Dann FORBES Hummer, MD;  Location: [facility] OR;  Service: General;  Laterality: N/A;  . TONSILLECTOMY    . TONSILLECTOMY AND ADENOIDECTOMY     Social History   Occupational History  . Occupation: Retired Runner, broadcasting/film/video  Tobacco Use  . Smoking status: Never    Passive exposure: Never  . Smokeless tobacco: Never  Vaping Use  . Vaping status: Never Used  Substance and Sexual Activity  . Alcohol  use: Yes    Alcohol /week: 7.0 - 10.0 standard drinks of alcohol     Types: 7 - 10 Glasses of wine per  week    Comment: 1-2 glasses of wine daily  . Drug use: No  . Sexual activity: Not on file

## 2023-12-20 ENCOUNTER — Other Ambulatory Visit (HOSPITAL_COMMUNITY): Payer: Self-pay | Admitting: *Deleted

## 2023-12-21 ENCOUNTER — Encounter (HOSPITAL_COMMUNITY)
Admission: RE | Admit: 2023-12-21 | Discharge: 2023-12-21 | Disposition: A | Discharge status: Home / Self Care | Source: Ambulatory Visit | Attending: Internal Medicine | Admitting: Internal Medicine

## 2023-12-21 DIAGNOSIS — M810 Age-related osteoporosis without current pathological fracture: Secondary | ICD-10-CM | POA: Diagnosis not present

## 2023-12-21 MED ORDER — DENOSUMAB 60 MG/ML ~~LOC~~ SOSY
PREFILLED_SYRINGE | SUBCUTANEOUS | Status: AC
  Filled 2023-12-21: qty 1

## 2023-12-21 MED ORDER — DENOSUMAB 60 MG/ML ~~LOC~~ SOSY
60.0000 mg | PREFILLED_SYRINGE | Freq: Once | SUBCUTANEOUS | Status: AC
  Administered 2023-12-21 (×2): 60 mg via SUBCUTANEOUS

## 2024-01-20 DIAGNOSIS — Z23 Encounter for immunization: Secondary | ICD-10-CM | POA: Diagnosis not present

## 2024-01-25 DIAGNOSIS — Z23 Encounter for immunization: Secondary | ICD-10-CM | POA: Diagnosis not present

## 2024-03-08 ENCOUNTER — Other Ambulatory Visit: Payer: Self-pay | Admitting: Internal Medicine

## 2024-03-08 DIAGNOSIS — Z1231 Encounter for screening mammogram for malignant neoplasm of breast: Secondary | ICD-10-CM

## 2024-03-13 ENCOUNTER — Encounter: Payer: Self-pay | Admitting: Radiology

## 2024-03-16 DIAGNOSIS — R5383 Other fatigue: Secondary | ICD-10-CM | POA: Diagnosis not present

## 2024-03-16 DIAGNOSIS — M8580 Other specified disorders of bone density and structure, unspecified site: Secondary | ICD-10-CM | POA: Diagnosis not present

## 2024-03-16 DIAGNOSIS — R7301 Impaired fasting glucose: Secondary | ICD-10-CM | POA: Diagnosis not present

## 2024-03-16 DIAGNOSIS — Z79899 Other long term (current) drug therapy: Secondary | ICD-10-CM | POA: Diagnosis not present

## 2024-03-23 DIAGNOSIS — J479 Bronchiectasis, uncomplicated: Secondary | ICD-10-CM | POA: Diagnosis not present

## 2024-03-23 DIAGNOSIS — G43909 Migraine, unspecified, not intractable, without status migrainosus: Secondary | ICD-10-CM | POA: Diagnosis not present

## 2024-03-23 DIAGNOSIS — M419 Scoliosis, unspecified: Secondary | ICD-10-CM | POA: Diagnosis not present

## 2024-03-23 DIAGNOSIS — M2042 Other hammer toe(s) (acquired), left foot: Secondary | ICD-10-CM | POA: Diagnosis not present

## 2024-03-23 DIAGNOSIS — Z1339 Encounter for screening examination for other mental health and behavioral disorders: Secondary | ICD-10-CM | POA: Diagnosis not present

## 2024-03-23 DIAGNOSIS — F325 Major depressive disorder, single episode, in full remission: Secondary | ICD-10-CM | POA: Diagnosis not present

## 2024-03-23 DIAGNOSIS — H532 Diplopia: Secondary | ICD-10-CM | POA: Diagnosis not present

## 2024-03-23 DIAGNOSIS — R7301 Impaired fasting glucose: Secondary | ICD-10-CM | POA: Diagnosis not present

## 2024-03-23 DIAGNOSIS — M170 Bilateral primary osteoarthritis of knee: Secondary | ICD-10-CM | POA: Diagnosis not present

## 2024-03-23 DIAGNOSIS — R82998 Other abnormal findings in urine: Secondary | ICD-10-CM | POA: Diagnosis not present

## 2024-03-23 DIAGNOSIS — M8580 Other specified disorders of bone density and structure, unspecified site: Secondary | ICD-10-CM | POA: Diagnosis not present

## 2024-03-23 DIAGNOSIS — G4700 Insomnia, unspecified: Secondary | ICD-10-CM | POA: Diagnosis not present

## 2024-03-23 DIAGNOSIS — R0989 Other specified symptoms and signs involving the circulatory and respiratory systems: Secondary | ICD-10-CM | POA: Diagnosis not present

## 2024-03-23 DIAGNOSIS — Z0000 Encounter for general adult medical examination without abnormal findings: Secondary | ICD-10-CM | POA: Diagnosis not present

## 2024-03-23 DIAGNOSIS — N3941 Urge incontinence: Secondary | ICD-10-CM | POA: Diagnosis not present

## 2024-03-23 DIAGNOSIS — H52203 Unspecified astigmatism, bilateral: Secondary | ICD-10-CM | POA: Diagnosis not present

## 2024-03-23 DIAGNOSIS — Z1331 Encounter for screening for depression: Secondary | ICD-10-CM | POA: Diagnosis not present

## 2024-03-27 ENCOUNTER — Ambulatory Visit (HOSPITAL_COMMUNITY)
Admission: RE | Admit: 2024-03-27 | Discharge: 2024-03-27 | Disposition: A | Discharge status: Home / Self Care | Source: Ambulatory Visit | Attending: Surgery | Admitting: Surgery

## 2024-03-27 ENCOUNTER — Other Ambulatory Visit (HOSPITAL_COMMUNITY): Payer: Self-pay | Admitting: Internal Medicine

## 2024-03-27 DIAGNOSIS — R0989 Other specified symptoms and signs involving the circulatory and respiratory systems: Secondary | ICD-10-CM

## 2024-04-04 ENCOUNTER — Ambulatory Visit (INDEPENDENT_AMBULATORY_CARE_PROVIDER_SITE_OTHER)

## 2024-04-04 ENCOUNTER — Encounter: Payer: Self-pay | Admitting: Podiatry

## 2024-04-04 ENCOUNTER — Ambulatory Visit (INDEPENDENT_AMBULATORY_CARE_PROVIDER_SITE_OTHER): Admitting: Podiatry

## 2024-04-04 DIAGNOSIS — M2042 Other hammer toe(s) (acquired), left foot: Secondary | ICD-10-CM

## 2024-04-04 DIAGNOSIS — M21612 Bunion of left foot: Secondary | ICD-10-CM | POA: Diagnosis not present

## 2024-04-04 DIAGNOSIS — D2372 Other benign neoplasm of skin of left lower limb, including hip: Secondary | ICD-10-CM | POA: Diagnosis not present

## 2024-04-04 NOTE — Progress Notes (Signed)
  Subjective:  Patient ID: Shelia Cunningham, female    DOB: 1940-08-10,   MRN: [id number]  Chief Complaint  Patient presents with   Foot Pain    I have a Hammer Toe and I have a Bunion on my left foot.    83 y.o. female presents for concern of left foot hammertoe and bunion deformities.  Relates a spot on the bottom of the left second toe area that has been painful.  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  She relates pain when doing this.  She has to wear her dance shoes which does cause discomfort.  She also is getting some pain on the top of the second toe.  Relates the bunion area is not quite so painful.. Denies any other pedal complaints. Denies n/v/f/c.   Past Medical History:  Diagnosis Date   Chronic migraine    GERD (gastroesophageal reflux disease)    Insomnia    Osteoporosis    Pneumonia, organism unspecified(486) 12-25-11   Vertigo, benign positional     Objective:  Physical Exam: Vascular: DP/PT pulses 2/4 bilateral. CFT <3 seconds. Normal hair growth on digits. No edema.  Skin. No lacerations or abrasions bilateral feet.  Cord hyperkeratotic  lesion noted to subsecond metatarsal head on left. Musculoskeletal: MMT 5/5 bilateral lower extremities in DF, PF, Inversion and Eversion. Deceased ROM in DF of ankle joint.  Moderate HAV deformity noted on left foot no tenderness to medial eminence.  No pain with range of motion of the first MPJ.  Hammered second through 5 digits noted.  Some tenderness noted to the dorsum of the second digit on the left.  No pain with range of motion of the digit.  Pain to the palpation of the plantar second metatarsal head and area of hyperkeratotic lesion. Neurological: Sensation intact to light touch.   Assessment:   1. Hammer toe of left foot   2. Bunion, left   3. Benign neoplasm of skin of foot, left      Plan:  Patient was evaluated and treated and all questions answered. -Xrays reviewed no  acute fractures or dislocations noted HAV deformity noted with IM 1 to angle of about 12 degrees.  Sesamoid position of 5.  Hammered digits 2 through 5 noted. -Discussed HAV and hammertoes treatment options;conservative and surgical management; risks, benefits, alternatives discussed. All patient's questions answered. -Discussed padding and wide shoe gear.   -Recommend continue with good supportive shoes and inserts.  Crest pads and toe caps provided to help with hammertoe pain. -Discussed surgical options.  Would like to avoid surgery -Discussed benign skin lesions with patient and treatment options.  -Hyperkeratotic tissue was debrided with chisel without incident.  -Applied salycylic acid treatment to area with dressing. Advised to remove bandaging tomorrow.  -Encouraged daily moisturizing -Discussed use of pumice stone -Advised good supportive shoes and inserts -Patient to return to office as needed or sooner if condition worsens.  Asberry Failing, DPM

## 2024-05-17 ENCOUNTER — Ambulatory Visit
Admission: RE | Admit: 2024-05-17 | Discharge: 2024-05-17 | Disposition: A | Discharge status: Home / Self Care | Source: Ambulatory Visit | Attending: Internal Medicine | Admitting: Internal Medicine

## 2024-05-17 DIAGNOSIS — Z1231 Encounter for screening mammogram for malignant neoplasm of breast: Secondary | ICD-10-CM

## 2024-05-30 ENCOUNTER — Other Ambulatory Visit (HOSPITAL_COMMUNITY): Payer: Self-pay | Admitting: Internal Medicine

## 2024-05-30 DIAGNOSIS — M810 Age-related osteoporosis without current pathological fracture: Secondary | ICD-10-CM | POA: Insufficient documentation

## 2024-05-30 NOTE — Progress Notes (Signed)
 NOTE: Per [facility] practice, MD approved biosimilars switch for Prolia per payor preferences.

## 2024-05-30 NOTE — Progress Notes (Signed)
 Received Prolia  referral from. Faxed Dr. Sheppard office for switch to Stoboclo  Next dose is due 06/22/2024 Calcium  wnl on 03/16/2024 (attached to referral)  Sherry Pennant, PharmD, MPH, BCPS, CPP Clinical Pharmacist

## 2024-05-31 ENCOUNTER — Telehealth (HOSPITAL_COMMUNITY): Payer: Self-pay | Admitting: Pharmacy Technician

## 2024-05-31 NOTE — Telephone Encounter (Signed)
 Auth Submission: NO AUTH NEEDED Site of care: [facility] Payer: MEDICARE A/B, [employer] Medication & CPT/J Code(s) submitted: Stoboclo (denosumab -bmwo) [id number] Diagnosis Code: M81.0 Route of submission (phone, fax, portal):  Phone # Fax # Auth type: Buy/Bill HB Units/visits requested: 60mg  x 2 doses, q 6 months Reference number:  Approval from: 05/31/2024 to 05/10/25   Dagoberto Armour, CPhT [facility] Phone: [phone] 05/31/2024

## 2024-06-26 ENCOUNTER — Inpatient Hospital Stay (HOSPITAL_COMMUNITY): Admission: RE | Admit: 2024-06-26 | Source: Ambulatory Visit

## 2024-09-18 ENCOUNTER — Ambulatory Visit: Admitting: Internal Medicine
